# Patient Record
Sex: Male | Born: 1960
Health system: Southern US, Community
[De-identification: ages and names within clinical notes are randomized; demographics above are authoritative.]

## PROBLEM LIST (undated history)

## (undated) DIAGNOSIS — I10 Essential (primary) hypertension: Secondary | ICD-10-CM

## (undated) DIAGNOSIS — I493 Ventricular premature depolarization: Secondary | ICD-10-CM

## (undated) DIAGNOSIS — M1990 Unspecified osteoarthritis, unspecified site: Secondary | ICD-10-CM

## (undated) DIAGNOSIS — H269 Unspecified cataract: Secondary | ICD-10-CM

## (undated) DIAGNOSIS — K219 Gastro-esophageal reflux disease without esophagitis: Secondary | ICD-10-CM

## (undated) DIAGNOSIS — G4730 Sleep apnea, unspecified: Secondary | ICD-10-CM

## (undated) DIAGNOSIS — T7840XA Allergy, unspecified, initial encounter: Secondary | ICD-10-CM

## (undated) DIAGNOSIS — E7439 Other disorders of intestinal carbohydrate absorption: Secondary | ICD-10-CM

## (undated) DIAGNOSIS — R7303 Prediabetes: Secondary | ICD-10-CM

## (undated) DIAGNOSIS — K429 Umbilical hernia without obstruction or gangrene: Secondary | ICD-10-CM

## (undated) DIAGNOSIS — E785 Hyperlipidemia, unspecified: Secondary | ICD-10-CM

## (undated) DIAGNOSIS — E669 Obesity, unspecified: Secondary | ICD-10-CM

## (undated) DIAGNOSIS — N529 Male erectile dysfunction, unspecified: Secondary | ICD-10-CM

## (undated) HISTORY — DX: Prediabetes: R73.03

## (undated) HISTORY — DX: Unspecified osteoarthritis, unspecified site: M19.90

## (undated) HISTORY — DX: Unspecified cataract: H26.9

## (undated) HISTORY — DX: Sleep apnea, unspecified: G47.30

## (undated) HISTORY — DX: Gastro-esophageal reflux disease without esophagitis: K21.9

## (undated) HISTORY — DX: Umbilical hernia without obstruction or gangrene: K42.9

## (undated) HISTORY — PX: HERNIA REPAIR: SHX51

## (undated) HISTORY — PX: POLYPECTOMY: SHX149

## (undated) HISTORY — DX: Male erectile dysfunction, unspecified: N52.9

## (undated) HISTORY — DX: Hyperlipidemia, unspecified: E78.5

## (undated) HISTORY — PX: COLONOSCOPY: SHX174

## (undated) HISTORY — PX: APPENDECTOMY: SHX54

## (undated) HISTORY — DX: Other disorders of intestinal carbohydrate absorption: E74.39

## (undated) HISTORY — DX: Obesity, unspecified: E66.9

## (undated) HISTORY — PX: TONSILLECTOMY: SUR1361

## (undated) HISTORY — PX: CHOLECYSTECTOMY: SHX55

## (undated) HISTORY — DX: Essential (primary) hypertension: I10

## (undated) HISTORY — PX: CATARACT EXTRACTION: SUR2

## (undated) HISTORY — DX: Ventricular premature depolarization: I49.3

## (undated) HISTORY — DX: Allergy, unspecified, initial encounter: T78.40XA

---

## 1998-06-20 ENCOUNTER — Emergency Department (HOSPITAL_COMMUNITY): Admission: EM | Admit: 1998-06-20 | Discharge: 1998-06-20 | Payer: Self-pay | Admitting: Emergency Medicine

## 1998-06-20 ENCOUNTER — Encounter: Payer: Self-pay | Admitting: Emergency Medicine

## 2002-09-13 ENCOUNTER — Encounter (INDEPENDENT_AMBULATORY_CARE_PROVIDER_SITE_OTHER): Payer: Self-pay

## 2002-09-13 ENCOUNTER — Inpatient Hospital Stay (HOSPITAL_COMMUNITY): Admission: EM | Admit: 2002-09-13 | Discharge: 2002-09-14 | Payer: Self-pay | Admitting: Emergency Medicine

## 2005-09-24 ENCOUNTER — Ambulatory Visit: Payer: Self-pay | Admitting: Family Medicine

## 2005-10-01 ENCOUNTER — Encounter: Admission: RE | Admit: 2005-10-01 | Discharge: 2005-12-30 | Payer: Self-pay | Admitting: Family Medicine

## 2005-11-09 ENCOUNTER — Ambulatory Visit: Payer: Self-pay | Admitting: Family Medicine

## 2006-12-07 ENCOUNTER — Ambulatory Visit: Payer: Self-pay | Admitting: Family Medicine

## 2006-12-07 LAB — CONVERTED CEMR LAB
ALT: 28 units/L (ref 0–40)
AST: 22 units/L (ref 0–37)
Albumin: 4.4 g/dL (ref 3.5–5.2)
Alkaline Phosphatase: 53 units/L (ref 39–117)
BUN: 19 mg/dL (ref 6–23)
Basophils Absolute: 0.1 10*3/uL (ref 0.0–0.1)
Basophils Relative: 1 % (ref 0.0–1.0)
Bilirubin, Direct: 0.1 mg/dL (ref 0.0–0.3)
CO2: 32 meq/L (ref 19–32)
Calcium: 9.8 mg/dL (ref 8.4–10.5)
Chloride: 102 meq/L (ref 96–112)
Cholesterol: 228 mg/dL (ref 0–200)
Creatinine, Ser: 1 mg/dL (ref 0.4–1.5)
Direct LDL: 120.6 mg/dL
Eosinophils Absolute: 0.3 10*3/uL (ref 0.0–0.6)
Eosinophils Relative: 5.9 % — ABNORMAL HIGH (ref 0.0–5.0)
GFR calc Af Amer: 104 mL/min
GFR calc non Af Amer: 86 mL/min
Glucose, Bld: 107 mg/dL — ABNORMAL HIGH (ref 70–99)
HCT: 46.4 % (ref 39.0–52.0)
HDL: 29.7 mg/dL — ABNORMAL LOW (ref >39.0)
Hemoglobin: 16 g/dL (ref 13.0–17.0)
Lymphocytes Relative: 27.2 % (ref 12.0–46.0)
MCHC: 34.4 g/dL (ref 30.0–36.0)
MCV: 86.3 fL (ref 78.0–100.0)
Monocytes Absolute: 0.5 10*3/uL (ref 0.2–0.7)
Monocytes Relative: 8.6 % (ref 3.0–11.0)
Neutro Abs: 3 10*3/uL (ref 1.4–7.7)
Neutrophils Relative %: 57.3 % (ref 43.0–77.0)
Platelets: 212 10*3/uL (ref 150–400)
Potassium: 3.9 meq/L (ref 3.5–5.1)
RBC: 5.38 M/uL (ref 4.22–5.81)
RDW: 12.3 % (ref 11.5–14.6)
Sodium: 141 meq/L (ref 135–145)
TSH: 2.33 microintl units/mL (ref 0.35–5.50)
Total Bilirubin: 1 mg/dL (ref 0.3–1.2)
Total CHOL/HDL Ratio: 7.7
Total Protein: 7.3 g/dL (ref 6.0–8.3)
Triglycerides: 266 mg/dL (ref 0–149)
VLDL: 53 mg/dL — ABNORMAL HIGH (ref 0–40)
WBC: 5.3 10*3/uL (ref 4.5–10.5)

## 2006-12-16 ENCOUNTER — Ambulatory Visit: Payer: Self-pay | Admitting: Family Medicine

## 2006-12-16 DIAGNOSIS — I10 Essential (primary) hypertension: Secondary | ICD-10-CM | POA: Insufficient documentation

## 2006-12-29 ENCOUNTER — Ambulatory Visit: Payer: Self-pay | Admitting: Pulmonary Disease

## 2007-02-01 ENCOUNTER — Ambulatory Visit: Payer: Self-pay | Admitting: Pulmonary Disease

## 2007-02-01 ENCOUNTER — Ambulatory Visit (HOSPITAL_BASED_OUTPATIENT_CLINIC_OR_DEPARTMENT_OTHER): Admission: RE | Admit: 2007-02-01 | Discharge: 2007-02-01 | Payer: Self-pay | Admitting: Pulmonary Disease

## 2007-03-30 ENCOUNTER — Ambulatory Visit: Payer: Self-pay | Admitting: Pulmonary Disease

## 2007-05-17 ENCOUNTER — Ambulatory Visit: Payer: Self-pay | Admitting: Pulmonary Disease

## 2007-05-23 ENCOUNTER — Ambulatory Visit: Payer: Self-pay | Admitting: Family Medicine

## 2007-05-23 ENCOUNTER — Telehealth: Payer: Self-pay | Admitting: Family Medicine

## 2007-05-23 DIAGNOSIS — N529 Male erectile dysfunction, unspecified: Secondary | ICD-10-CM | POA: Insufficient documentation

## 2007-05-23 LAB — CONVERTED CEMR LAB
BUN: 16 mg/dL (ref 6–23)
CO2: 30 meq/L (ref 19–32)
Calcium: 10.3 mg/dL (ref 8.4–10.5)
Chloride: 97 meq/L (ref 96–112)
Creatinine, Ser: 1.1 mg/dL (ref 0.4–1.5)
GFR calc Af Amer: 93 mL/min
GFR calc non Af Amer: 77 mL/min
Glucose, Bld: 95 mg/dL (ref 70–99)
Hgb A1c MFr Bld: 5.8 % (ref 4.6–6.0)
Potassium: 3.7 meq/L (ref 3.5–5.1)
Sodium: 137 meq/L (ref 135–145)

## 2008-06-25 ENCOUNTER — Ambulatory Visit: Payer: Self-pay | Admitting: Family Medicine

## 2008-06-25 DIAGNOSIS — K429 Umbilical hernia without obstruction or gangrene: Secondary | ICD-10-CM | POA: Insufficient documentation

## 2008-09-14 ENCOUNTER — Ambulatory Visit: Payer: Self-pay | Admitting: Family Medicine

## 2008-09-14 LAB — CONVERTED CEMR LAB
ALT: 50 units/L (ref 0–53)
AST: 36 units/L (ref 0–37)
Albumin: 4.3 g/dL (ref 3.5–5.2)
Alkaline Phosphatase: 60 units/L (ref 39–117)
BUN: 18 mg/dL (ref 6–23)
Basophils Absolute: 0 10*3/uL (ref 0.0–0.1)
Basophils Relative: 0.1 % (ref 0.0–3.0)
Bilirubin, Direct: 0.1 mg/dL (ref 0.0–0.3)
CO2: 29 meq/L (ref 19–32)
Calcium: 9.5 mg/dL (ref 8.4–10.5)
Chloride: 105 meq/L (ref 96–112)
Cholesterol: 243 mg/dL (ref 0–200)
Creatinine, Ser: 1.1 mg/dL (ref 0.4–1.5)
Direct LDL: 149.9 mg/dL
Eosinophils Absolute: 0.4 10*3/uL (ref 0.0–0.7)
Eosinophils Relative: 6.6 % — ABNORMAL HIGH (ref 0.0–5.0)
GFR calc Af Amer: 92 mL/min
GFR calc non Af Amer: 76 mL/min
Glucose, Bld: 123 mg/dL — ABNORMAL HIGH (ref 70–99)
Glucose, Urine, Semiquant: NEGATIVE
HCT: 47.3 % (ref 39.0–52.0)
HDL: 36.8 mg/dL — ABNORMAL LOW (ref >39.0)
Hemoglobin: 16.4 g/dL (ref 13.0–17.0)
Hgb A1c MFr Bld: 5.6 % (ref 4.6–6.0)
Lymphocytes Relative: 29.6 % (ref 12.0–46.0)
MCHC: 34.7 g/dL (ref 30.0–36.0)
MCV: 88.2 fL (ref 78.0–100.0)
Monocytes Absolute: 0.5 10*3/uL (ref 0.1–1.0)
Monocytes Relative: 9.1 % (ref 3.0–12.0)
Neutro Abs: 2.9 10*3/uL (ref 1.4–7.7)
Neutrophils Relative %: 54.6 % (ref 43.0–77.0)
Nitrite: NEGATIVE
PSA: 1.42 ng/mL (ref 0.10–4.00)
Platelets: 182 10*3/uL (ref 150–400)
Potassium: 4 meq/L (ref 3.5–5.1)
RBC: 5.36 M/uL (ref 4.22–5.81)
RDW: 12.5 % (ref 11.5–14.6)
Sodium: 144 meq/L (ref 135–145)
Specific Gravity, Urine: 1.025
TSH: 2.22 microintl units/mL (ref 0.35–5.50)
Total Bilirubin: 0.9 mg/dL (ref 0.3–1.2)
Total CHOL/HDL Ratio: 6.6
Total Protein: 7.2 g/dL (ref 6.0–8.3)
Triglycerides: 164 mg/dL — ABNORMAL HIGH (ref 0–149)
Urobilinogen, UA: 0.2
VLDL: 33 mg/dL (ref 0–40)
WBC Urine, dipstick: NEGATIVE
WBC: 5.4 10*3/uL (ref 4.5–10.5)
pH: 5.5

## 2008-09-21 ENCOUNTER — Ambulatory Visit: Payer: Self-pay | Admitting: Family Medicine

## 2008-09-21 DIAGNOSIS — E739 Lactose intolerance, unspecified: Secondary | ICD-10-CM | POA: Insufficient documentation

## 2008-11-14 ENCOUNTER — Ambulatory Visit: Payer: Self-pay | Admitting: Family Medicine

## 2008-11-30 DIAGNOSIS — G4733 Obstructive sleep apnea (adult) (pediatric): Secondary | ICD-10-CM | POA: Insufficient documentation

## 2008-12-03 ENCOUNTER — Ambulatory Visit: Payer: Self-pay | Admitting: Cardiovascular Disease

## 2008-12-03 DIAGNOSIS — R079 Chest pain, unspecified: Secondary | ICD-10-CM | POA: Insufficient documentation

## 2008-12-13 ENCOUNTER — Telehealth (INDEPENDENT_AMBULATORY_CARE_PROVIDER_SITE_OTHER): Payer: Self-pay | Admitting: *Deleted

## 2008-12-17 ENCOUNTER — Encounter: Payer: Self-pay | Admitting: Cardiovascular Disease

## 2008-12-17 ENCOUNTER — Ambulatory Visit: Payer: Self-pay

## 2008-12-20 ENCOUNTER — Ambulatory Visit: Payer: Self-pay | Admitting: Cardiovascular Disease

## 2010-05-26 ENCOUNTER — Telehealth: Payer: Self-pay | Admitting: Family Medicine

## 2010-07-23 ENCOUNTER — Telehealth: Payer: Self-pay | Admitting: Family Medicine

## 2010-07-31 ENCOUNTER — Other Ambulatory Visit: Payer: Self-pay | Admitting: Family Medicine

## 2010-07-31 ENCOUNTER — Ambulatory Visit
Admission: RE | Admit: 2010-07-31 | Discharge: 2010-07-31 | Payer: Self-pay | Source: Home / Self Care | Attending: Family Medicine | Admitting: Family Medicine

## 2010-07-31 LAB — BASIC METABOLIC PANEL
BUN: 27 mg/dL — ABNORMAL HIGH (ref 6–23)
CO2: 28 mEq/L (ref 19–32)
Calcium: 10 mg/dL (ref 8.4–10.5)
Chloride: 97 mEq/L (ref 96–112)
Creatinine, Ser: 1 mg/dL (ref 0.4–1.5)
GFR: 85.43 mL/min (ref >60.00)
Glucose, Bld: 122 mg/dL — ABNORMAL HIGH (ref 70–99)
Potassium: 3.7 mEq/L (ref 3.5–5.1)
Sodium: 138 mEq/L (ref 135–145)

## 2010-07-31 LAB — CBC WITH DIFFERENTIAL/PLATELET
Basophils Absolute: 0 10*3/uL (ref 0.0–0.1)
Basophils Relative: 0.5 % (ref 0.0–3.0)
Eosinophils Absolute: 0.2 10*3/uL (ref 0.0–0.7)
Eosinophils Relative: 3.2 % (ref 0.0–5.0)
HCT: 49.8 % (ref 39.0–52.0)
Hemoglobin: 17.3 g/dL — ABNORMAL HIGH (ref 13.0–17.0)
Lymphocytes Relative: 15.8 % (ref 12.0–46.0)
Lymphs Abs: 1 10*3/uL (ref 0.7–4.0)
MCHC: 34.6 g/dL (ref 30.0–36.0)
MCV: 89.6 fl (ref 78.0–100.0)
Monocytes Absolute: 0.5 10*3/uL (ref 0.1–1.0)
Monocytes Relative: 7.4 % (ref 3.0–12.0)
Neutro Abs: 4.7 10*3/uL (ref 1.4–7.7)
Neutrophils Relative %: 73.1 % (ref 43.0–77.0)
Platelets: 175 10*3/uL (ref 150.0–400.0)
RBC: 5.56 Mil/uL (ref 4.22–5.81)
RDW: 13.1 % (ref 11.5–14.6)
WBC: 6.5 10*3/uL (ref 4.5–10.5)

## 2010-07-31 LAB — CONVERTED CEMR LAB
Blood in Urine, dipstick: NEGATIVE
Glucose, Urine, Semiquant: NEGATIVE
Nitrite: NEGATIVE
Specific Gravity, Urine: >=1.03
Urobilinogen, UA: 1
WBC Urine, dipstick: NEGATIVE
pH: 5.5

## 2010-07-31 LAB — HEPATIC FUNCTION PANEL
ALT: 29 U/L (ref 0–53)
AST: 22 U/L (ref 0–37)
Albumin: 4.8 g/dL (ref 3.5–5.2)
Alkaline Phosphatase: 54 U/L (ref 39–117)
Bilirubin, Direct: 0.1 mg/dL (ref 0.0–0.3)
Total Bilirubin: 1.1 mg/dL (ref 0.3–1.2)
Total Protein: 7.6 g/dL (ref 6.0–8.3)

## 2010-07-31 LAB — TSH: TSH: 1.76 u[IU]/mL (ref 0.35–5.50)

## 2010-07-31 LAB — HEMOGLOBIN A1C: Hgb A1c MFr Bld: 5.4 % (ref 4.6–6.5)

## 2010-07-31 LAB — LIPID PANEL
Cholesterol: 265 mg/dL — ABNORMAL HIGH (ref 0–200)
HDL: 42.3 mg/dL (ref >39.00)
Total CHOL/HDL Ratio: 6
Triglycerides: 149 mg/dL (ref 0.0–149.0)
VLDL: 29.8 mg/dL (ref 0.0–40.0)

## 2010-07-31 LAB — LDL CHOLESTEROL, DIRECT: Direct LDL: 197 mg/dL

## 2010-08-07 ENCOUNTER — Encounter: Payer: Self-pay | Admitting: Family Medicine

## 2010-08-07 ENCOUNTER — Ambulatory Visit
Admission: RE | Admit: 2010-08-07 | Discharge: 2010-08-07 | Payer: Self-pay | Source: Home / Self Care | Attending: Family Medicine | Admitting: Family Medicine

## 2010-08-07 DIAGNOSIS — F438 Other reactions to severe stress: Secondary | ICD-10-CM | POA: Insufficient documentation

## 2010-08-07 DIAGNOSIS — E785 Hyperlipidemia, unspecified: Secondary | ICD-10-CM | POA: Insufficient documentation

## 2010-08-07 DIAGNOSIS — F4389 Other reactions to severe stress: Secondary | ICD-10-CM | POA: Insufficient documentation

## 2010-08-12 ENCOUNTER — Observation Stay (HOSPITAL_COMMUNITY)
Admission: EM | Admit: 2010-08-12 | Discharge: 2010-08-13 | Payer: Self-pay | Source: Home / Self Care | Admitting: Emergency Medicine

## 2010-08-12 ENCOUNTER — Emergency Department (HOSPITAL_COMMUNITY)
Admission: EM | Admit: 2010-08-12 | Discharge: 2010-08-12 | Disposition: A | Discharge status: Other Acute Inpatient Hospital | Payer: Self-pay | Source: Home / Self Care | Admitting: Family Medicine

## 2010-08-13 ENCOUNTER — Encounter (INDEPENDENT_AMBULATORY_CARE_PROVIDER_SITE_OTHER): Payer: Self-pay | Admitting: Emergency Medicine

## 2010-08-13 LAB — BASIC METABOLIC PANEL
BUN: 13 mg/dL (ref 6–23)
CO2: 29 mEq/L (ref 19–32)
Calcium: 10.9 mg/dL — ABNORMAL HIGH (ref 8.4–10.5)
Chloride: 101 mEq/L (ref 96–112)
Creatinine, Ser: 0.98 mg/dL (ref 0.4–1.5)
GFR calc Af Amer: >60 mL/min (ref >60)
GFR calc non Af Amer: >60 mL/min (ref >60)
Glucose, Bld: 102 mg/dL — ABNORMAL HIGH (ref 70–99)
Potassium: 3.6 mEq/L (ref 3.5–5.1)
Sodium: 140 mEq/L (ref 135–145)

## 2010-08-13 LAB — POCT CARDIAC MARKERS
CKMB, poc: <1 ng/mL — ABNORMAL LOW (ref 1.0–8.0)
CKMB, poc: <1 ng/mL — ABNORMAL LOW (ref 1.0–8.0)
CKMB, poc: <1 ng/mL — ABNORMAL LOW (ref 1.0–8.0)
CKMB, poc: <1 ng/mL — ABNORMAL LOW (ref 1.0–8.0)
Myoglobin, poc: 41.4 ng/mL (ref 12–200)
Myoglobin, poc: 40.7 ng/mL (ref 12–200)
Myoglobin, poc: 41.2 ng/mL (ref 12–200)
Myoglobin, poc: 53.4 ng/mL (ref 12–200)
Troponin i, poc: <0.05 ng/mL (ref 0.00–0.09)
Troponin i, poc: <0.05 ng/mL (ref 0.00–0.09)
Troponin i, poc: <0.05 ng/mL (ref 0.00–0.09)
Troponin i, poc: <0.05 ng/mL (ref 0.00–0.09)

## 2010-08-13 LAB — CBC
HCT: 45 % (ref 39.0–52.0)
Hemoglobin: 16.3 g/dL (ref 13.0–17.0)
MCH: 30.2 pg (ref 26.0–34.0)
MCHC: 36.2 g/dL — ABNORMAL HIGH (ref 30.0–36.0)
MCV: 83.3 fL (ref 78.0–100.0)
Platelets: 202 10*3/uL (ref 150–400)
RBC: 5.4 MIL/uL (ref 4.22–5.81)
RDW: 12.2 % (ref 11.5–15.5)
WBC: 8.9 10*3/uL (ref 4.0–10.5)

## 2010-08-18 ENCOUNTER — Ambulatory Visit
Admission: RE | Admit: 2010-08-18 | Discharge: 2010-08-18 | Payer: Self-pay | Source: Home / Self Care | Attending: Cardiovascular Disease | Admitting: Cardiovascular Disease

## 2010-08-22 ENCOUNTER — Telehealth: Payer: Self-pay | Admitting: Cardiovascular Disease

## 2010-08-25 ENCOUNTER — Telehealth: Payer: Self-pay | Admitting: Family Medicine

## 2010-08-26 NOTE — Progress Notes (Signed)
Summary: hctz refill  Phone Note Refill Request Message from:  Fax from Pharmacy on May 26, 2010 12:21 PM  Refills Requested: Medication #1:  HYDROCHLOROTHIAZIDE 25 MG TABS 1 tablet by mouth every morning Initial call taken by: Kern Reap CMA Duncan Dull),  May 26, 2010 12:21 PM    Prescriptions: HYDROCHLOROTHIAZIDE 25 MG TABS (HYDROCHLOROTHIAZIDE) 1 tablet by mouth every morning  #30 x 0   Entered by:   Kern Reap CMA (AAMA)   Authorized by:   Roderick Pee MD   Signed by:   Kern Reap CMA (AAMA) on 05/26/2010   Method used:   Electronically to        [facility]* (retail)       [address]       [address] Shipping/mailing       [place], [place]  [address]       Ph: [phone]       Fax: [phone]   RxID:   [id number]

## 2010-08-28 NOTE — Progress Notes (Signed)
Summary: rx sent to [facility] today, pt aware   Phone Note Call from Patient Call back at [phone]   Caller: Mom Summary of Call: Pt needs more samples of Nexium Initial call taken by: Judie Grieve,  August 22, 2010 4:47 PM    Prescriptions: NEXIUM 40 MG PACK (ESOMEPRAZOLE MAGNESIUM) take one tablet by mouth daily.  #90 x 0   Entered by:   Danielle Rankin, CMA   Authorized by:   Verne Carrow, MD   Signed by:   Danielle Rankin, CMA on 08/22/2010   Method used:   Electronically to        [facility]* (retail)       [address]       [address] Shipping/mailing       [place], [place]  [address]       Ph: [phone]       Fax: [phone]   RxID:   [id number]

## 2010-08-28 NOTE — Assessment & Plan Note (Signed)
Summary: cpx//ccm   Vital Signs:  Patient profile:   50 year old male Height:      68.25 inches Weight:      201 pounds BMI:     30.45 Temp:     98.3 degrees F oral BP sitting:   100 / 70  (left arm) Cuff size:   regular  Vitals Entered By: Romualdo Bolk, CMA Duncan Dull) (August 07, 2010 3:57 PM) CC: CPX   Primary Care Provider:  Roderick Pee MD  CC:  CPX.  History of Present Illness: Nadine Counts  is a 50 year old woman, who comes in today for general physical examination  He only takes a baby aspirin daily.  He takes a hydrochlorothiazide 25 mg nightly p.r.n. for fluid retention.  His screening labs show a slightly elevated blood sugar 122 however, his hemoglobin A1c is normal.  Over the past year and a, half he's lost about 50 pounds via diet and exercise.  Lipids panel abnormal with a total cholesterol 265 and a LDL of 197.  Both his mother and his father are on lipid-lowering drugs.  He recently got a promotion in the [employer], and this is causing tremendous stress and anxiety, especially with the new [employer] computer system.  He seems to be functioning well.  No depression, but wants to know if there is something he takes on a p.r.n. for anxiety.  If he needs it.  Tetanus 2008.  Preventive Screening-Counseling & Management  Alcohol-Tobacco     Smoking Status: never  Caffeine-Diet-Exercise     Does Patient Exercise: no     Exercise (avg: min/session): 12:00  Current Medications (verified): 1)  Hydrochlorothiazide 25 Mg Tabs (Hydrochlorothiazide) .Marland Kitchen.. 1 Tablet By Mouth Every Morning 2)  Aspirin 81 Mg Tbec (Aspirin) .... Take One Tablet By Mouth Daily  Allergies (verified): No Known Drug Allergies  Past History:  Past medical, surgical, family and social histories (including risk factors) reviewed, and no changes noted (except as noted below).  Past Medical History: Reviewed history from 12/03/2008 and no changes required. PREMATURE VENTRICULAR CONTRACTIONS  (ICD-427.69) HYPERTENSION (ICD-401.9) OBESITY (ICD-278.00) GLUCOSE INTOLERANCE (ICD-271.3) SLEEP APNEA (ICD-780.57) UMBILICAL HERNIA (ICD-553.1) ABNORMAL GLUCOSE NEC (ICD-790.29) ERECTILE DYSFUNCTION, ORGANIC (ICD-607.84) Hyperlipidemia    Past Surgical History: Reviewed history from 12/03/2008 and no changes required. Appendectomy Tonsillectomy  Family History: Reviewed history from 12/03/2008 and no changes required. Mother: Bipolar disorder, arthritis Father:  Alive, intracerebral hemorrhage Pt denies family history of major GI disease, cancer, or diabetes. No CAD Grandmother with CHF in early 42s  Social History: Reviewed history from 12/03/2008 and no changes required. 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 No apparent alopecia or balding. Eyes:  No corneal or conjunctival inflammation noted. EOMI. Perrla. Funduscopic exam benign, without hemorrhages, exudates or papilledema. Vision grossly normal. Ears:  External ear exam shows no significant lesions or deformities.  Otoscopic examination reveals clear canals, tympanic membranes are intact bilaterally without bulging, retraction, inflammation or discharge. Hearing is grossly normal bilaterally. Nose:  External nasal examination shows no deformity or inflammation. Nasal mucosa are pink and moist without lesions or exudates. Mouth:  Oral mucosa and oropharynx without lesions or exudates.  Teeth in good repair. Neck:  No deformities, masses, or tenderness noted. Chest Wall:  No deformities, masses, tenderness or gynecomastia noted. Breasts:  No masses or gynecomastia  noted Lungs:  Normal respiratory effort, chest expands symmetrically. Lungs are clear to auscultation, no crackles or wheezes. Heart:  Normal rate and regular rhythm. S1 and S2 normal without gallop, murmur, click, rub or other extra sounds. Abdomen:  Bowel sounds positive,abdomen soft and non-tender without masses, organomegaly or hernias noted. Rectal:  No external abnormalities noted. Normal sphincter tone. No rectal masses or tenderness. Genitalia:  Testes bilaterally descended without nodularity, tenderness or masses. No scrotal masses or lesions. No penis lesions or urethral discharge. Prostate:  Prostate gland firm and smooth, no enlargement, nodularity, tenderness, mass, asymmetry or induration. Msk:  No deformity or scoliosis noted of thoracic or lumbar spine.   Pulses:  R and L carotid,radial,femoral,dorsalis pedis and posterior tibial pulses are full and equal bilaterally Extremities:  No clubbing, cyanosis, edema, or deformity noted with normal full range of motion of all joints.   Neurologic:  No cranial nerve deficits noted. Station and gait are normal. Plantar reflexes are down-going bilaterally. DTRs are symmetrical throughout. Sensory, motor and coordinative functions appear intact. Skin:  Intact without suspicious lesions or rashes Cervical Nodes:  No lymphadenopathy noted Axillary Nodes:  No palpable lymphadenopathy Inguinal Nodes:  No significant adenopathy Psych:  Cognition and judgment appear intact. Alert and cooperative with normal attention span and concentration. No apparent delusions, illusions, hallucinations   Problems:  Medical Problems Added: 1)  Dx of Routine General Medical Exam@health  Care Facl  (ICD-V70.0) 2)  Dx of Routine General Medical Exam@health  Care Facl  (ICD-V70.0) 3)  Dx of Anxiety, Situational, Acute  (ICD-308.3) 4)  Dx of Hyperlipidemia  (ICD-272.4)  Impression & Recommendations:  Problem # 1:  ANXIETY, SITUATIONAL, ACUTE  (ICD-308.3) Assessment New  Orders: Prescription Created Electronically [phone])  Problem # 2:  HYPERLIPIDEMIA (ICD-272.4) Assessment: New  His updated medication list for this problem includes:    Simvastatin 20 Mg Tabs (Simvastatin) .Marland Kitchen... 1 tab @ bedtime  Orders: Prescription Created Electronically [phone])  Problem # 3:  HYPERTENSION (ICD-401.9) Assessment: Improved  His updated medication list for this problem includes:    Hydrochlorothiazide 25 Mg Tabs (Hydrochlorothiazide) .Marland Kitchen... 1 tablet by mouth every morning  Orders: Prescription Created Electronically [phone])  Problem # 4:  OBESITY (ICD-278.00) Assessment: Improved  Problem # 5:  GLUCOSE INTOLERANCE (ICD-271.3) Assessment: Improved  Orders: Prescription Created Electronically [phone])  Complete Medication List: 1)  Hydrochlorothiazide 25 Mg Tabs (Hydrochlorothiazide) .Marland Kitchen.. 1 tablet by mouth every morning 2)  Aspirin 81 Mg Tbec (Aspirin) .... Take one tablet by mouth daily 3)  Simvastatin 20 Mg Tabs (Simvastatin) .Marland Kitchen.. 1 tab @ bedtime 4)  Ativan 0.5 Mg Tabs (Lorazepam) .Marland Kitchen.. 1 by mouth two times a day as needed  Patient Instructions: 1)  begin Zocor 20 mg nightly 2)  Hepatic Panel prior to visit, ICD-9:....250.00 3)  Lipid Panel prior to visit, ICD-9: 4)  Please schedule a follow-up appointment in 2 months. 5)  Check a fasting blood sugar once weekly. 6)  Continue your exercise program. 7)  Ativan .5 p.r.n. Prescriptions: SIMVASTATIN 20 MG TABS (SIMVASTATIN) 1 tab @ bedtime  #100 x 3   Entered and Authorized by:   Roderick Pee MD   Signed by:   Roderick Pee MD on 08/07/2010   Method used:   Electronically to        [facility]* (retail)  [address]       [address] Shipping/mailing       [place], [place]  [address]       Ph: [phone]       Fax: [phone]   RxID:   [id number] ATIVAN 0.5 MG TABS (LORAZEPAM) 1 by mouth two times a day as needed  #60 x 3   Entered  and Authorized by:   Roderick Pee MD   Signed by:   Roderick Pee MD on 08/07/2010   Method used:   Print then Give to Patient   RxID:   [id number] SIMVASTATIN 20 MG TABS (SIMVASTATIN) 1 tab @ bedtime  #100 x 3   Entered and Authorized by:   Roderick Pee MD   Signed by:   Roderick Pee MD on 08/07/2010   Method used:   Electronically to        [facility]* (retail)       [address]       [address] Shipping/mailing       [place], [place]  [address]       Ph: [phone]       Fax: [phone]   RxID:   [id number]    Orders Added: 1)  Est. Patient 40-64 years [[address]] 2)  Prescription Created Electronically [phone]   Immunization History:  Tetanus/Td Immunization History:    Tetanus/Td:  historical (07/27/2006)   Immunization History:  Tetanus/Td Immunization History:    Tetanus/Td:  Historical (07/27/2006)

## 2010-08-28 NOTE — Assessment & Plan Note (Signed)
Summary: ROV/PT HAD CP X 2 WKS/ED 2 DAYS AGO WITH TESTING/MYOVIEW/GLC  Medications Added NEXIUM 40 MG PACK (ESOMEPRAZOLE MAGNESIUM) take one tablet by mouth daily.      Allergies Added: NKDA  Visit Type:  Follow-up Primary Provider:  Roderick Pee MD  CC:  chest pain.  History of Present Illness: 50 yo WM with history of hyperlipidemia, HTN, borderline DM, obesity and sleep apnea who was referred for last year for further evaluation of PVCs and chest pain with exercise. 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 This showed PVCs. He was sent to see Dr. Tawanna Cooler for further evaluation and had a 12 lead EKG that showed PVCs with normal sinus rhythm.  He was referred for a nuclear stress test to rule out ischemia and a surface echo to look for structural heart problems. He exercised for 12 minutes and had no ischemic EKG changes. His perfusion images were normal. ECHO showed normal LV function with mild diastolic dysfunction. No significant valvular issues.   He is here today for follow up. He tells me that he had an episode of chest pain last week. He was at work when it started. He was admitted to [facility]. He ruled out for an MI with enzymes. Stress echo on 08/13/10 was normal. No ischemic EKG changes with exercise. He describes a weight on his chest. This lasts for hours. This is substernal with no radiation and no associated diaphoresis or SOB but there was some nausea. No exertional pain. He has not been aware of any irregularity of his heart rhythm.    Current Medications (verified): 1)  Hydrochlorothiazide 25 Mg Tabs (Hydrochlorothiazide) .Marland Kitchen.. 1 Tablet By Mouth Every Morning 2)  Aspirin 81 Mg Tbec (Aspirin) .... Take One Tablet By Mouth Daily 3)  Simvastatin 20 Mg Tabs (Simvastatin) .Marland Kitchen.. 1 Tab @ Bedtime 4)  Ativan 0.5 Mg Tabs (Lorazepam) .Marland Kitchen.. 1 By Mouth Two Times A Day As  Needed  Allergies (verified): No Known Drug Allergies  Past History:  Past Medical History: Reviewed history from 12/03/2008 and no changes required. PREMATURE VENTRICULAR CONTRACTIONS (ICD-427.69) HYPERTENSION (ICD-401.9) OBESITY (ICD-278.00) GLUCOSE INTOLERANCE (ICD-271.3) SLEEP APNEA (ICD-780.57) UMBILICAL HERNIA (ICD-553.1) ABNORMAL GLUCOSE NEC (ICD-790.29) ERECTILE DYSFUNCTION, ORGANIC (ICD-607.84) Hyperlipidemia    Past Surgical History: Reviewed history from 12/03/2008 and no changes required. Appendectomy Tonsillectomy  Family History: Reviewed history from 12/03/2008 and no changes required. Mother: Bipolar disorder, arthritis Father:  Alive, intracerebral hemorrhage Pt denies family history of major GI disease, cancer, or diabetes. No CAD Grandmother with CHF in early 76s  Social History: Reviewed history from 12/03/2008 and no changes required. 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  The patient denies fatigue, malaise, fever, weight gain/loss, vision loss, decreased hearing, hoarseness, palpitations, shortness of breath, prolonged cough, wheezing, sleep apnea, coughing up blood, abdominal pain, blood in stool, nausea, vomiting, diarrhea, heartburn, incontinence, blood in urine, muscle weakness, joint pain, leg swelling, rash, skin lesions, headache, fainting, dizziness, depression, anxiety, enlarged lymph nodes, easy bruising or bleeding, and environmental allergies.    Vital Signs:  Patient profile:   50 year old male Height:      68.25 inches Weight:  215 pounds BMI:     32.57 Pulse rate:   71 / minute Resp:     16 per minute BP sitting:   132 / 85  (left arm)  Vitals Entered By: Marrion Coy, CNA (August 18, 2010 9:36 AM)  Physical Exam  General:  General: Well  developed, well nourished, NAD HEENT: OP clear, mucus membranes moist SKIN: warm, dry Neuro: No focal deficits Musculoskeletal: Muscle strength 5/5 all ext Psychiatric: Mood and affect normal Neck: No JVD, no carotid bruits, no thyromegaly, no lymphadenopathy. Lungs:Clear bilaterally, no wheezes, rhonci, crackles CV: RRR no murmurs, gallops rubs Abdomen: soft, NT, ND, BS present Extremities: No edema, pulses 2+.    EKG  Procedure date:  08/12/2010  Findings:      NSR, rate 89 bpm. LAFB.  Stress Echocardiogram  Procedure date:  08/13/2010  Findings:      - Stress ECG conclusions: The stress ECG was normal.   - Staged echo: Normal echo stress   - Impressions: Good exercise tolerance.   Impressions:    - Normal study after maximal exercise. Good exercise tolerance.  Impression & Recommendations:  Problem # 1:  CHEST PAIN (ICD-786.50) Atypical.  Most likely non-cardiac. Stress echo normal last week. Possible GERD. Will start Nexium 40 mg by mouth Qdaily and see back in 2 weeks. If still having chest pain, will consider cardiac cath to exclude CAD.   His updated medication list for this problem includes:    Aspirin 81 Mg Tbec (Aspirin) .Marland Kitchen... Take one tablet by mouth daily  Patient Instructions: 1)  Your physician recommends that you schedule a follow-up appointment in: 2 weeks 2)  Your physician recommends that you continue on your current medications as directed. Please refer to the Current Medication list given to you today. Start Nexium  40mg  by mouth daily.

## 2010-08-28 NOTE — Progress Notes (Signed)
Summary: med refill  Phone Note Refill Request Message from:  Patient  Refills Requested: Medication #1:  HYDROCHLOROTHIAZIDE 25 MG TABS 1 tablet by mouth every morning pt has sch ov in 08-11-2010 needs refill please. [facility] [id number]  Initial call taken by: Heron Sabins,  July 23, 2010 3:56 PM  Follow-up for Phone Call        Rx called to pharmacy Follow-up by: Alfred Levins, CMA,  July 23, 2010 4:33 PM    Prescriptions: HYDROCHLOROTHIAZIDE 25 MG TABS (HYDROCHLOROTHIAZIDE) 1 tablet by mouth every morning  #30 x 0   Entered by:   Alfred Levins, CMA   Authorized by:   Roderick Pee MD   Signed by:   Alfred Levins, CMA on 07/23/2010   Method used:   Electronically to        [facility]* (retail)       [address]       [address] Shipping/mailing       [place], [place]  [address]       Ph: [phone]       Fax: [phone]   RxID:   [id number]

## 2010-09-03 NOTE — Progress Notes (Signed)
Summary: hctz refill  Phone Note Refill Request Message from:  Fax from Pharmacy on August 25, 2010 4:46 PM  Refills Requested: Medication #1:  HYDROCHLOROTHIAZIDE 25 MG TABS 1 tablet by mouth every morning Initial call taken by: Kern Reap CMA Duncan Dull),  August 25, 2010 4:46 PM    Prescriptions: HYDROCHLOROTHIAZIDE 25 MG TABS (HYDROCHLOROTHIAZIDE) 1 tablet by mouth every morning  #90 x 3   Entered by:   Kern Reap CMA (AAMA)   Authorized by:   Roderick Pee MD   Signed by:   Kern Reap CMA (AAMA) on 08/25/2010   Method used:   Electronically to        [facility]* (retail)       [address]       [address] Shipping/mailing       [place], [place]  [address]       Ph: [phone]       Fax: [phone]   RxID:   [id number]

## 2010-09-05 ENCOUNTER — Encounter: Payer: Self-pay | Admitting: Cardiovascular Disease

## 2010-09-05 ENCOUNTER — Ambulatory Visit (INDEPENDENT_AMBULATORY_CARE_PROVIDER_SITE_OTHER): Payer: Commercial Managed Care - PPO | Admitting: Cardiovascular Disease

## 2010-09-05 DIAGNOSIS — R079 Chest pain, unspecified: Secondary | ICD-10-CM

## 2010-09-11 NOTE — Assessment & Plan Note (Addendum)
Summary: f/u 2 weeks/wpa      Allergies Added: NKDA  Visit Type:  2 -week follow up  Primary Provider:  Roderick Pee MD  CC:  still having chest pain.  History of Present Illness: 50 yo WM with history of hyperlipidemia, HTN, borderline DM, obesity and sleep apnea who was referred last year for further evaluation of PVCs and chest pain with exercise. 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 This showed PVCs. He was sent to see Dr. Tawanna Cooler for further evaluation and had a 12 lead EKG that showed PVCs with normal sinus rhythm.  He was referred for a nuclear stress test to rule out ischemia and a surface echo to look for structural heart problems. He exercised for 12 minutes and had no ischemic EKG changes. His perfusion images were normal. ECHO showed normal LV function with mild diastolic dysfunction. No significant valvular issues.   I saw him two weeks ago for follow up. He told me that he had an episode of severe chest pain and was admitted to [facility] where he ruled out for an MI with serial enzymes. Stress echo on 08/13/10 was normal. No ischemic EKG changes with exercise. He described  a weight on his chest. This lasts for hours. This is substernal with no radiation and no associated diaphoresis or SOB but there was some nausea. No exertional pain. He has not been aware of any irregularity of his heart rhythm. I started him on Nexium at the last visit. His chest pain resolved for 10 days. He had recurrence at work three days ago while stressed.  This is described as a heaviness in the center of his chest. No associated SOB, dizziness, near syncope, syncope, diaphoresis.   Current Medications (verified): 1)  Hydrochlorothiazide 25 Mg Tabs (Hydrochlorothiazide) .Marland Kitchen.. 1 Tablet By Mouth Every Morning 2)  Aspirin 81 Mg Tbec (Aspirin) .... Take One Tablet By Mouth Daily 3)   Simvastatin 20 Mg Tabs (Simvastatin) .Marland Kitchen.. 1 Tab @ Bedtime 4)  Ativan 0.5 Mg Tabs (Lorazepam) .Marland Kitchen.. 1 By Mouth Two Times A Day As Needed 5)  Nexium 40 Mg Pack (Esomeprazole Magnesium) .... Take One Tablet By Mouth Daily.  Allergies (verified): No Known Drug Allergies  Past History:  Past Medical History: Reviewed history from 12/03/2008 and no changes required. PREMATURE VENTRICULAR CONTRACTIONS (ICD-427.69) HYPERTENSION (ICD-401.9) OBESITY (ICD-278.00) GLUCOSE INTOLERANCE (ICD-271.3) SLEEP APNEA (ICD-780.57) UMBILICAL HERNIA (ICD-553.1) ABNORMAL GLUCOSE NEC (ICD-790.29) ERECTILE DYSFUNCTION, ORGANIC (ICD-607.84) Hyperlipidemia    Social History: Reviewed history from 12/03/2008 and no changes required. 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  The patient denies fatigue, malaise, fever, weight gain/loss, vision loss, decreased hearing, hoarseness, palpitations, shortness of breath, prolonged cough, wheezing, sleep apnea, coughing up blood, abdominal pain, blood in stool, nausea, vomiting, diarrhea, heartburn, incontinence, blood in urine, muscle weakness, joint pain, leg swelling, rash, skin lesions, headache, fainting, dizziness, depression, anxiety, enlarged lymph nodes, easy bruising or bleeding, and environmental allergies.    Vital Signs:  Patient profile:   50 year old male Height:      68.25 inches Weight:      210.50 pounds BMI:     31.89 Pulse  rate:   72 / minute BP sitting:   108 / 78  (right arm) Cuff size:   regular  Vitals Entered By: Caralee Ates CMA (September 05, 2010 9:27 AM)  Physical Exam  General:  General: Well developed, well nourished, NAD Musculoskeletal: Muscle strength 5/5 all ext Psychiatric: Mood and affect normal Neck: No JVD, no carotid bruits, no thyromegaly, no  lymphadenopathy. Lungs:Clear bilaterally, no wheezes, rhonci, crackles CV: RRR no murmurs, gallops rubs Abdomen: soft, NT, ND, BS present Extremities: No edema, pulses 2+.    Impression & Recommendations:  Problem # 1:  CHEST PAIN (ICD-786.50) Likely non-cardiac pain. Continue PPI. If his pain does not resolve completely after treatment for GERD, would favor GI workup. If this is negative, would likely pursue cardiac cath to exclude CAD.   His updated medication list for this problem includes:    Aspirin 81 Mg Tbec (Aspirin) .Marland Kitchen... Take one tablet by mouth daily  Patient Instructions: 1)  Your physician recommends that you schedule a follow-up appointment in: one year.

## 2010-10-16 ENCOUNTER — Ambulatory Visit: Payer: Commercial Managed Care - PPO

## 2010-10-16 ENCOUNTER — Other Ambulatory Visit: Payer: Self-pay

## 2010-10-16 DIAGNOSIS — E119 Type 2 diabetes mellitus without complications: Secondary | ICD-10-CM

## 2010-10-16 LAB — HEPATIC FUNCTION PANEL
ALT: 24 U/L (ref 0–53)
AST: 20 U/L (ref 0–37)
Albumin: 4.3 g/dL (ref 3.5–5.2)
Alkaline Phosphatase: 55 U/L (ref 39–117)
Bilirubin, Direct: 0.1 mg/dL (ref 0.0–0.3)
Total Bilirubin: 0.7 mg/dL (ref 0.3–1.2)
Total Protein: 7 g/dL (ref 6.0–8.3)

## 2010-10-16 LAB — LIPID PANEL
Cholesterol: 166 mg/dL (ref 0–200)
HDL: 47.7 mg/dL (ref >39.00)
LDL Cholesterol: 107 mg/dL — ABNORMAL HIGH (ref 0–99)
Total CHOL/HDL Ratio: 3
Triglycerides: 56 mg/dL (ref 0.0–149.0)
VLDL: 11.2 mg/dL (ref 0.0–40.0)

## 2010-10-22 ENCOUNTER — Encounter: Payer: Self-pay | Admitting: Family Medicine

## 2010-10-23 ENCOUNTER — Ambulatory Visit (INDEPENDENT_AMBULATORY_CARE_PROVIDER_SITE_OTHER): Payer: 59 | Admitting: Family Medicine

## 2010-10-23 ENCOUNTER — Encounter: Payer: Self-pay | Admitting: Family Medicine

## 2010-10-23 DIAGNOSIS — E785 Hyperlipidemia, unspecified: Secondary | ICD-10-CM

## 2010-10-30 NOTE — Patient Instructions (Signed)
Continue current medications follow-up in one year 

## 2010-10-30 NOTE — Progress Notes (Signed)
  Subjective:    Patient ID: Travis Martinez, male    DOB: 1961/02/26, 50 y.o.   MRN: [id number]  Travis Martinez is a 50 year old male, who comes in today for follow-up of hyperlipidemia.  At his last physical exam his total cholesterol was 265.  On Zocor 20 mg a day.  His total dropped to 165 and his LDL dropped to 107.  Liver functions normal.  No side effects from medication.  He also takes an aspirin tablet daily    Review of Systems General and metabolic review of systems otherwise negative    Objective:   Physical Exam    Well-developed well-nourished, male in no acute distress    Assessment & Plan:  Hyperlipidemia,,,,,,,,, with respect to go on Zocor 20 mg nightly continue medication follow-up and annual physical

## 2010-11-20 ENCOUNTER — Other Ambulatory Visit: Payer: Self-pay | Admitting: Cardiovascular Disease

## 2010-11-24 ENCOUNTER — Other Ambulatory Visit: Payer: Self-pay | Admitting: Cardiology

## 2010-11-24 DIAGNOSIS — K219 Gastro-esophageal reflux disease without esophagitis: Secondary | ICD-10-CM

## 2010-11-24 MED ORDER — ESOMEPRAZOLE MAGNESIUM 40 MG PO CPDR: 40.0000 mg | DELAYED_RELEASE_CAPSULE | Freq: Every day | ORAL | Status: DC

## 2010-11-24 NOTE — Telephone Encounter (Signed)
Nexium 40 mg refilled. 

## 2010-12-09 NOTE — Assessment & Plan Note (Signed)
[facility]                             PULMONARY OFFICE NOTE   ARSALAN, BRISBIN                       MRN:          [id number]  DATE:05/17/2007                            DOB:          21-Apr-1961    I saw Mr. Broski in followup today for his severe obstructive sleep  apnea.   He is currently on CPAP at 7 cmH2O with nasal pillows and heated  humidifier.  He says he has noticed an improvement in his sleep quality  as well as his energy level during the day.  He will go to bed at around  11 o'clock at night.  He has no trouble falling asleep.  However, he  wakes up around 2 to 3 o'clock in the morning and then again at about 5  or 6 o'clock in the morning but he is usually able to go right back to  sleep.  He gets up in the morning between 7 and 7:30.  He says he will  occasionally have air leaking from his mouth.  He also sometimes wakes  up because his mask shifts around.  He has not tried any other masks for  a sustained period of time at home.  His medication list was reviewed  without any changes.   PHYSICAL EXAM:  He is 242 pounds, temperature 98.7, blood pressure  122/80, heart rate 72, oxygen saturation 92% on room air.  HEENT:  There is no sinus tenderness, no oral lesions.  HEART:  Regular S1 S2.  CHEST:  Clear to auscultation.  ABDOMEN:  Obese, soft, nontender.  EXTREMITIES:  No edema.   IMPRESSION:  Severe obstructive sleep apnea on CPAP of 7 centimeters  water.  What I will do is provide him with a chin strap and then monitor  to see if this improves his symptom status as well as his sleep quality  during the night.  If he is still having these problems, we may need to  consider doing an auto CPAP titration study at home to determine if he  needs any adjustments in his pressure setting as he may still be having  some residual events during his rapid eye movement sleep.   I will follow up with him in about 4 months.     Coralyn Helling, MD  Electronically Signed    VS/MedQ  DD: 05/17/2007  DT: 05/17/2007  Job #: [id number]   cc:   Tinnie Gens A. Tawanna Cooler, MD

## 2010-12-09 NOTE — Assessment & Plan Note (Signed)
[facility]                             PULMONARY OFFICE NOTE   Travis Martinez, Travis Martinez                       MRN:          [id number]  DATE:03/30/2007                            DOB:          Aug 03, 1960    I saw Travis Martinez today in followup for his sleep apnea.   He had undergone an overnight polysomnogram on February 01, 2007.  He  followed a split-night study protocol.   During the diagnostic portion of the test, he was found to have severe  obstructive sleep apnea with an apnea/hypopnea index of 40 and an oxygen  saturation rate of 81%.   During the therapeutic portion of the test, he was titrated to a CPAP  pressure setting of 7 cm of water with a reduction in his apnea/hypopnea  index to 0.6.  At this pressure setting, he was observed in both REM  sleep and supine sleep.   I have reviewed the results of his sleep study with him.  I have again  reviewed the adverse consequences of untreated sleep apnea, including  risk of hypertension, coronary artery disease, cerebrovascular disease,  diabetes, and arrhythmias.  I have, again, emphasized to him the  importance of diet, exercise, and weight reduction, as well as the  avoidance of alcohol and sedatives.  Driving precautions were reviewed  with him as well.  I have also discussed the various treatment options  for his sleep apnea, including CPAP therapy, oral appliance, and  surgical intervention.   Given the severity of the sleep apnea, I have advised him that his best  option would be to continue his CPAP therapy.  I will, therefore, start  him on CPAP at 7 cm of water with heated humidification.   I will then follow up with him in the office in approximately 6 to 8  weeks.     Coralyn Helling, MD  Electronically Signed    VS/MedQ  DD: 03/30/2007  DT: 03/30/2007  Job #: [id number]   cc:   Tinnie Gens A. Tawanna Cooler, MD

## 2010-12-09 NOTE — Procedures (Signed)
NAME:  Travis Martinez, Travis Martinez                ACCOUNT NO.:  [id number]   MEDICAL RECORD NO.:  [id number]          PATIENT TYPE:  OUT   LOCATION:  SLEEP CENTER                 FACILITY:  [facility]   PHYSICIAN:  Coralyn Helling, MD        DATE OF BIRTH:  [birth year]   DATE OF STUDY:  02/01/2007                            NOCTURNAL POLYSOMNOGRAM   REFERRING PHYSICIAN:  Coralyn Helling, MD   INDICATION FOR STUDY:  This is an individual who has a history of  hypertension as well as fatigue, excess in daytime sleepiness, and sleep  disruption.  He is referred to the sleep lab for evaluation of  hypersomnia with obstructive sleep apnea.   EPWORTH SLEEPINESS SCORE:  14.   MEDICATIONS:  Aspirin, hydrochlorothiazide.   SLEEP ARCHITECTURE:  Total recorded time was 452 minutes.  Total sleep  time was 404 minutes.  Sleep efficiency was 89%.  Sleep latency was 13.5  minutes.  REM latency was 99.5 minutes.  The patient was observed in all  stages of sleep, but had reduction in the percentage of slow-wave sleep  to 8% of the study.  The patient slept in both the supine and non-supine  positions.   RESPIRATORY DATA:  The average respiratory rate was 16.  The patient  followed a split-night study protocol.  During the diagnostic portion of  the study, he was found to have an apnea/hypopnea index of 40.4.  The  events were exclusively obstructive in nature.  Loud snoring was noted  by the technician.  During the therapeutic portion of the study, the  patient was titrated from a CPAP pressure setting of 5 to 7 cmH2O.  At a  CPAP pressure setting of 7 cmH2O, the apnea/hypopnea index was reduced  to 0.6.  At this pressure setting, the patient was observed in REM  sleep, supine sleep, and snoring was eliminated.   OXYGEN DATA:  The baseline oxygenation was 94%.  The oxygen saturation  nadir was 81%.  At a CPAP pressure setting of 7 cmH2O, the mean  oxygenation during non-REM sleep was 94%, the mean oxygenation during  REM  sleep was 93%, the minimal oxygenation during non-REM sleep was 88%,  and the minimal oxygenation during REM sleep was 90%.   CARDIAC DATA:  The rhythm strip showed normal sinus rhythm, with sinus  bradycardia.   MOVEMENT-PARASOMNIA:  The period limb movement index was 8.3.   IMPRESSIONS-RECOMMENDATIONS:  This was a split-night study protocol.  During the diagnostic portion of the study, the patient was found to  have severe obstructive sleep apnea, as demonstrated by an  apnea/hypopnea index of 40.4, with an oxygen saturation nadir of 81%.  During the therapeutic portion of the study, he was titrated  to a CPAP pressure setting of 7 cmH2O.  At this pressure setting, his  apnea/hypopnea index was reduced to 0.6, and he was observed in both REM  sleep and supine sleep at this pressure setting.      Coralyn Helling, MD  Diplomat, American Board of Sleep Medicine  Electronically Signed     VS/MEDQ  D:  02/09/2007 12:48:32  T:  02/10/2007 08:45:15  Job:  [id number]

## 2010-12-09 NOTE — Assessment & Plan Note (Signed)
[facility]                             PULMONARY OFFICE NOTE   Travis Martinez, Travis Martinez                       MRN:          [id number]  DATE:12/29/2006                            DOB:          [birth year]    REFERRING PHYSICIAN:  Tinnie Gens A. Tawanna Cooler, MD   SLEEP CONSULTATION:  I met Travis Martinez today for evaluation of his sleep  difficulties.   He has had this problem for several years.  His wife is concerned that  he may, in fact, have sleep apnea.  This is of particular concern given  his history of hypertension.  He says that he has also had problems with  waking up with sweats in the middle of the night, which has gotten  progressively worse.  His wife says that she has seen him gasp, as well  as snore loudly at night.  He has trouble sleeping on his back.  He  tends to breathe through his mouth while he is asleep and will  occasionally talk while he is asleep.  He goes to bed between 11 and 1  a.m.  He usually has no difficulty falling asleep.  Oftentimes, he will  actually lie down to take a nap in the evening, as well as taking naps  on weekends.  He sleeps through the night and wakes up at about 7:30 in  the morning during the work week.  He says it is extremely difficult for  him to get up and he often will have to hit the snooze button several  times.  He will sleep in until 10 or 11 o'clock in the morning on  weekends.  He says he occasionally has vivid dreams, but usually has  trouble remembering his dreams.  There is no history of sleep-walking.  He denies any symptoms of restless leg syndrome.  He has no history of  sleep hallucinations, sleep paralysis, cataplexy.  He does not use  anything to help him fall asleep at night.  He will occasionally drink  some coffee to help him stay awake during the day.   PAST MEDICAL HISTORY:  Significant for hypertension.   PAST SURGICAL HISTORY:  Significant for an appendectomy and  tonsillectomy.   CURRENT MEDICATIONS:  1. Hydrochlorothiazide 25 mg daily.  2. Aspirin 81 mg daily.   ALLERGIES:  He has no known drug allergies.   SOCIAL HISTORY:  He is married.  He has two children.  He works at [employer] in the [employer].  He quit smoking 30 years ago and has a  two pack-year history of smoking.  He quit drinking alcohol in January  of 2008.  He said he used to drink about a six-pack of beer on a daily  basis before this.   FAMILY HISTORY:  Significant for his mother, who has hypertension and  elevated cholesterol.  His father had hypertension, sleep apnea and  elevated cholesterol.   REVIEW OF SYSTEMS:  His Epworth score today is 9/24.  Also he had lost  approximately 40 pounds and said that his sleep had improved  considerably and that, as he had regained the weight, his sleep  difficulties got progressively worse.   PHYSICAL EXAM:  He is 5 feet 8 inches tall, 244 pounds.  Temperature is  98.6, blood pressure is 118/82, heart rate 71, oxygen saturation 94% on  room air.  HEENT:  Pupils are reactive.  There is no sinus tenderness.  He has a  septal deviation to the left with alar collapse with inhalation, more  prominent on the left.  He has a Mallampati 3 airway with decreased AP  diameter of the oropharynx.  There is no lymphadenopathy, no  thyromegaly.  HEART:  S1, S2, regular rate and rhythm.  CHEST:  Clear to auscultation.  ABDOMEN:  Obese, soft, nontender.  EXTREMITIES:  There is no edema, cyanosis or clubbing.  NEUROLOGIC EXAM:  No focal deficits were appreciated.   IMPRESSION:  1. He certainly has symptoms, as well as physical findings, which      would be consistent with sleep disordered breathing.  This is of      particular concern, given his history of hypertension.  I have      discussed with him the diagnosis of sleep apnea.  I had reviewed      with him the adverse consequences of untreated sleep apnea,      including risks of hypertension,  coronary disease, cerebrovascular      disease, diabetes and arrhythmias.  I discussed with him the      importance of diet, exercise and weight reduction, as well as      avoidance of alcohol and sedatives.  Driving precautions were      reviewed with him, as well.  To further evaluate this, I will make      arrangements for him to undergo an overnight polysomnogram.  I will      follow up with him after I have a chance to review his sleep study.  2. Nasal septal deviation:  I discussed with him that he could try      using BreatheRight strips to see if this helps with his symptoms of      nasal congestion.  If he does have sleep apnea and he is having      difficulty tolerating C-PAP therapy, then ENT evaluation may also      be warranted.     Coralyn Helling, MD  Electronically Signed    VS/MedQ  DD: 12/29/2006  DT: 12/29/2006  Job #: [id number]

## 2010-12-12 NOTE — Op Note (Signed)
NAMETONEY, Travis Martinez                          ACCOUNT NO.:  [id number]   MEDICAL RECORD NO.:  [id number]                   PATIENT TYPE:  INP   LOCATION:  5733                                 FACILITY:  [facility]   PHYSICIAN:  Angelia Mould. Derrell Lolling, M.D.             DATE OF BIRTH:  Mar 04, 1961   DATE OF PROCEDURE:  09/13/2002  DATE OF DISCHARGE:                                 OPERATIVE REPORT   PREOPERATIVE DIAGNOSIS:  Acute appendicitis.   POSTOPERATIVE DIAGNOSIS:  Acute appendicitis.   OPERATION PERFORMED:  Laparoscopic appendectomy.   SURGEON:  Angelia Mould. Derrell Lolling, M.D.   OPERATIVE INDICATION:  This is a 50 year old white man who presents with a  16-to-18-hour history of abdominal pain and nausea.  The pain has been  progressive and is now localized in the right lower quadrant.  On  examination, he is found to have a localized tenderness in the right lower  quadrant with direct and indirect rebound.  White blood cell count is  elevated slightly to 10,800 with a left shift and his urinalysis is normal.  He is brought to the operating room urgently for appendectomy.   OPERATIVE FINDINGS:  The appendix was acutely inflamed with some exudate on  it but was not ruptured, and there was no evidence of gangrene.  The  terminal ileum and cecum looked fine.  The sigmoid colon looked normal.  The  liver and gallbladder looked normal.   OPERATIVE TECHNIQUE:  Following the induction of general endotracheal  anesthesia, a Foley catheter and orogastric tube were inserted.  The abdomen  was prepped and draped in a sterile fashion.  Next, 0.5% Marcaine with  epinephrine was used as a local-infiltration anesthetic.  A vertically  oriented incision was made at the superior rim of the umbilicus.  The fascia  was incised in the midline and the abdominal cavity entered under direct  vision.  A 10-mm Hasson trocar was inserted and secured with a pursestring  suture of 0 Vicryl.  Pneumoperitoneum was  created.  The camera was inserted  with visualization and findings as described above.  A 5-mm trocar was  placed in the right upper quadrant and a 12-mm trocar was placed in the  suprapubic area.  We identified the appendix and identified the tip of the  appendix all the way back to its insertion on the cecum.  We controlled the  appendix with an Endoloop tie.  We took down the mesoappendix with the  harmonic scalpel.  We skeletonized the mesoappendix and mobilized the  appendix all the way back to its insertion on the cecum.  An Endo GI stapler  was then inserted and positioned across the base of the appendix, closed,  fired and removed.  This provided a very secure transection of the appendix  and a very secure staple line on the cecum.  The appendix was placed in a  specimen bag and  removed.   The operative field was irrigated with saline.  There was no bleeding and no  purulent fluid left.  The staple line was again inspected and looked fine.  There were no loose staples.  The trocars were removed under direct vision,  and there was no bleeding from the trocar sites.  The pneumoperitoneum was  released.  The fascia at the umbilicus and the fascia in the suprapubic port  were closed with 0 Vicryl sutures.  The wounds were irrigated with saline.  The skin  was closed with subcuticular sutures of 4-0 Vicryl and Steri-Strips.  Clean  bandages were placed.  The patient was taken to the recovery room in stable  condition.  Estimated blood loss was about 15-20 mL.  Complications:  None.  Sponge, needle and instrument counts were correct.                                               Angelia Mould. Derrell Lolling, M.D.    HMI/MEDQ  D:  09/13/2002  T:  09/14/2002  Job:  [id number]   cc:   Tinnie Gens A. Tawanna Cooler, M.D. [facility]

## 2010-12-12 NOTE — H&P (Signed)
NAME:  Travis Martinez, Travis Martinez                          ACCOUNT NO.:  [id number]   MEDICAL RECORD NO.:  [id number]                   PATIENT TYPE:  EMS   LOCATION:  [facility]                                 FACILITY:  [facility]   PHYSICIAN:  Angelia Mould. Derrell Lolling, M.D.             DATE OF BIRTH:  01/11/1961   DATE OF ADMISSION:  09/13/2002  DATE OF DISCHARGE:                                HISTORY & PHYSICAL   CHIEF COMPLAINT:  Abdominal pain.   HISTORY OF PRESENT ILLNESS:  This is a 50 year old white man who was  previously well. He presents with a 16 to 18 hour history of abdominal pain  and nausea. The pain began centrally but is now a bit more severe and now in  the right lower quadrant. He had one soft mushy stool, no blood in the  stool. Denies fever or chills. No prior episodes. No prior GI problems. He  was seen by Dr. Kelle Darting who felt that he had appendicitis and was sent  to the [facility] emergency room for my evaluation.   PAST MEDICAL HISTORY:  1. He has had some gastroesophageal reflux disease symptoms in the past     which are intermittent.  2. He had tonsillectomy as a child.  3. He had pneumonia as a child.  4. He has otherwise been healthy.   CURRENT MEDICATIONS:  Afrin spray occasionally.   ALLERGIES:  None known.   SOCIAL HISTORY:  The patient is married, has two children. 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 Denies use of alcohol or tobacco  although he used to drink.   FAMILY HISTORY:  Mother living, has arthritis and bipolar disorder. Father  living, has arrhythmia. Denies family history of major GI disease, cancer,  or diabetes.   REVIEW OF SYMPTOMS:  The patient has had a 30-pound weight loss over the  past six weeks intentionally on a diet. Otherwise, all systems are reviewed  and are noncontributory except as described above.   PHYSICAL EXAMINATION:  GENERAL:  Very pleasant, young man, slightly  overweight, in moderate distress from abdominal  pain.  VITAL SIGNS:  Temperature 97.3, pulse 72, respirations 20, blood pressure  112/90.  EYES:  Sclerae clear. Extraocular movements intact.  ENT:  External auditory canals clear. Nose clear. Lips, tongue, and  oropharynx look normal.  NECK:  Supple, nontender, no mass. Thyroid not enlarged. No thyroid mass. No  adenopathy. No jugular venous distention.  LUNGS:  Clear to auscultation.  HEART:  Regular rate and rhythm. No murmur.  ABDOMEN:  Soft but tender in the right lower quadrant. He has voluntary and  involuntary direct and indirect rebound in the right lower quadrant. I do  not feel a mass. Liver and spleen are not enlarged. Bowel sounds are  diminished. He does not seem distended.  GENITOURINARY:  Penis, scrotum, and testes feel normal. I do not feel any  hernia or adenopathy in the groin.  RECTAL:  Deferred.  EXTREMITIES:  Good range of motion. No deformity. No edema. Good pulses.  NEUROLOGICAL:  No gross motor or sensory deficits.   ADMISSION LABORATORY DATA:  Urinalysis is normal. CBC pending.   IMPRESSION:  Acute appendicitis.   PLAN:  We will await the results of his CBC, but it is almost certain that  he will need to go to the operating room for diagnostic laparoscopy and  appendectomy.   I have discussed the indications and details of surgery with the patient and  his wife. Risks and complications have been outlined, including but not  limited to bleeding, infection, conversion to open laparotomy, error in  diagnosis which might include diverticulitis, Meckel's diverticulum, Crohn's  disease, viral gastroenteritis, or other unforeseen problems which may  necessitate a more extensive laparotomy. Wound problems such as infection or  hernia are discussed. He seems to understand all of these issues well. At  this time, all of his questions are answered. He is in agreement with this  plan.                                               Angelia Mould. Derrell Lolling, M.D.     HMI/MEDQ  D:  09/13/2002  T:  09/13/2002  Job:  [id number]   cc:   Tinnie Gens A. Tawanna Cooler, M.D. [facility]

## 2011-01-17 ENCOUNTER — Emergency Department (HOSPITAL_COMMUNITY)
Admission: EM | Admit: 2011-01-17 | Discharge: 2011-01-18 | Disposition: A | Discharge status: Home / Self Care | Payer: 59 | Attending: Emergency Medicine | Admitting: Emergency Medicine

## 2011-01-17 ENCOUNTER — Emergency Department (HOSPITAL_COMMUNITY): Payer: 59

## 2011-01-17 DIAGNOSIS — S2249XA Multiple fractures of ribs, unspecified side, initial encounter for closed fracture: Secondary | ICD-10-CM | POA: Insufficient documentation

## 2011-01-17 DIAGNOSIS — I10 Essential (primary) hypertension: Secondary | ICD-10-CM | POA: Insufficient documentation

## 2011-01-17 DIAGNOSIS — M545 Low back pain, unspecified: Secondary | ICD-10-CM | POA: Insufficient documentation

## 2011-01-17 DIAGNOSIS — E7800 Pure hypercholesterolemia, unspecified: Secondary | ICD-10-CM | POA: Insufficient documentation

## 2011-01-17 DIAGNOSIS — S32009A Unspecified fracture of unspecified lumbar vertebra, initial encounter for closed fracture: Secondary | ICD-10-CM | POA: Insufficient documentation

## 2011-01-17 DIAGNOSIS — R079 Chest pain, unspecified: Secondary | ICD-10-CM | POA: Insufficient documentation

## 2011-01-17 LAB — POCT I-STAT, CHEM 8
BUN: 24 mg/dL — ABNORMAL HIGH (ref 6–23)
Calcium, Ion: 1.11 mmol/L — ABNORMAL LOW (ref 1.12–1.32)
Chloride: 106 mEq/L (ref 96–112)
Creatinine, Ser: 1.2 mg/dL (ref 0.50–1.35)
Glucose, Bld: 155 mg/dL — ABNORMAL HIGH (ref 70–99)
HCT: 47 % (ref 39.0–52.0)
Hemoglobin: 16 g/dL (ref 13.0–17.0)
Potassium: 3.2 mEq/L — ABNORMAL LOW (ref 3.5–5.1)
Sodium: 140 mEq/L (ref 135–145)
TCO2: 23 mmol/L (ref 0–100)

## 2011-01-17 MED ORDER — IOHEXOL 300 MG/ML  SOLN: 125.0000 mL | Freq: Once | INTRAMUSCULAR | Status: AC | PRN

## 2011-01-18 MED ORDER — IOHEXOL 300 MG/ML  SOLN
125.0000 mL | Freq: Once | INTRAMUSCULAR | Status: AC | PRN
  Administered 2011-01-18: 125 mL via INTRAVENOUS

## 2011-01-20 ENCOUNTER — Encounter: Payer: Self-pay | Admitting: Family Medicine

## 2011-01-20 ENCOUNTER — Ambulatory Visit (INDEPENDENT_AMBULATORY_CARE_PROVIDER_SITE_OTHER): Payer: 59 | Admitting: Family Medicine

## 2011-01-20 VITALS — BP 120/84 | Temp 98.4°F | Wt 230.0 lb

## 2011-01-20 DIAGNOSIS — S2239XA Fracture of one rib, unspecified side, initial encounter for closed fracture: Secondary | ICD-10-CM

## 2011-01-20 DIAGNOSIS — S32009A Unspecified fracture of unspecified lumbar vertebra, initial encounter for closed fracture: Secondary | ICD-10-CM

## 2011-01-20 MED ORDER — OXYCODONE-ACETAMINOPHEN 5-325 MG PO TABS: ORAL_TABLET | ORAL | Status: DC

## 2011-01-20 NOTE — Patient Instructions (Signed)
Take one half to one tablet of the oxycodone 3 to 4 times a day as needed for pain.  Return in two weeks for follow-up
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
## 2011-11-03 ENCOUNTER — Other Ambulatory Visit: Payer: Self-pay | Admitting: *Deleted

## 2011-11-03 MED ORDER — SIMVASTATIN 20 MG PO TABS: 20.0000 mg | ORAL_TABLET | Freq: Every day | ORAL | Status: DC

## 2011-11-03 MED ORDER — HYDROCHLOROTHIAZIDE 25 MG PO TABS: 25.0000 mg | ORAL_TABLET | Freq: Every day | ORAL | Status: DC

## 2012-03-03 ENCOUNTER — Other Ambulatory Visit: Payer: Self-pay | Admitting: Family Medicine

## 2012-03-03 ENCOUNTER — Telehealth: Payer: Self-pay | Admitting: Family Medicine

## 2012-03-03 NOTE — Telephone Encounter (Signed)
rx sent

## 2012-03-03 NOTE — Telephone Encounter (Signed)
Patient called stating that he need a refill of his HCTZ and zocor these refills have run out Pharmacy is [facility]. Please assist.

## 2012-04-13 ENCOUNTER — Other Ambulatory Visit (INDEPENDENT_AMBULATORY_CARE_PROVIDER_SITE_OTHER): Payer: 59

## 2012-04-13 DIAGNOSIS — R5381 Other malaise: Secondary | ICD-10-CM

## 2012-04-13 DIAGNOSIS — R5383 Other fatigue: Secondary | ICD-10-CM

## 2012-04-13 DIAGNOSIS — Z0000 Encounter for general adult medical examination without abnormal findings: Secondary | ICD-10-CM

## 2012-04-13 LAB — CBC WITH DIFFERENTIAL/PLATELET
Basophils Absolute: 0 10*3/uL (ref 0.0–0.1)
Basophils Relative: 0.8 % (ref 0.0–3.0)
Eosinophils Relative: 5.7 % — ABNORMAL HIGH (ref 0.0–5.0)
HCT: 47.8 % (ref 39.0–52.0)
Hemoglobin: 16.3 g/dL (ref 13.0–17.0)
Lymphocytes Relative: 25.5 % (ref 12.0–46.0)
Lymphs Abs: 1.5 10*3/uL (ref 0.7–4.0)
Monocytes Relative: 9.4 % (ref 3.0–12.0)
Neutro Abs: 3.4 10*3/uL (ref 1.4–7.7)
RBC: 5.4 Mil/uL (ref 4.22–5.81)
RDW: 13.1 % (ref 11.5–14.6)

## 2012-04-13 LAB — TESTOSTERONE: Testosterone: 249.59 ng/dL — ABNORMAL LOW (ref 350.00–890.00)

## 2012-04-13 LAB — POCT URINALYSIS DIPSTICK
Bilirubin, UA: NEGATIVE
Glucose, UA: NEGATIVE
Leukocytes, UA: NEGATIVE
Nitrite, UA: NEGATIVE
Urobilinogen, UA: 0.2
pH, UA: 6

## 2012-04-13 LAB — BASIC METABOLIC PANEL
Calcium: 9.8 mg/dL (ref 8.4–10.5)
GFR: 68.61 mL/min (ref >60.00)
Glucose, Bld: 114 mg/dL — ABNORMAL HIGH (ref 70–99)
Potassium: 4.3 mEq/L (ref 3.5–5.1)
Sodium: 142 mEq/L (ref 135–145)

## 2012-04-13 LAB — LIPID PANEL
HDL: 37.5 mg/dL — ABNORMAL LOW (ref >39.00)
LDL Cholesterol: 113 mg/dL — ABNORMAL HIGH (ref 0–99)
Total CHOL/HDL Ratio: 5
VLDL: 37 mg/dL (ref 0.0–40.0)

## 2012-04-13 LAB — HEPATIC FUNCTION PANEL
ALT: 44 U/L (ref 0–53)
Bilirubin, Direct: 0.1 mg/dL (ref 0.0–0.3)
Total Bilirubin: 0.8 mg/dL (ref 0.3–1.2)
Total Protein: 7.1 g/dL (ref 6.0–8.3)

## 2012-04-13 LAB — PSA: PSA: 1.46 ng/mL (ref 0.10–4.00)

## 2012-04-13 LAB — HEMOGLOBIN A1C: Hgb A1c MFr Bld: 5.7 % (ref 4.6–6.5)

## 2012-04-18 ENCOUNTER — Ambulatory Visit (INDEPENDENT_AMBULATORY_CARE_PROVIDER_SITE_OTHER): Payer: 59 | Admitting: Family Medicine

## 2012-04-18 ENCOUNTER — Encounter: Payer: Self-pay | Admitting: Family Medicine

## 2012-04-18 DIAGNOSIS — I10 Essential (primary) hypertension: Secondary | ICD-10-CM

## 2012-04-18 DIAGNOSIS — E669 Obesity, unspecified: Secondary | ICD-10-CM

## 2012-04-18 DIAGNOSIS — G4730 Sleep apnea, unspecified: Secondary | ICD-10-CM

## 2012-04-18 DIAGNOSIS — R7309 Other abnormal glucose: Secondary | ICD-10-CM

## 2012-04-18 DIAGNOSIS — K439 Ventral hernia without obstruction or gangrene: Secondary | ICD-10-CM

## 2012-04-18 DIAGNOSIS — Z0000 Encounter for general adult medical examination without abnormal findings: Secondary | ICD-10-CM

## 2012-04-18 DIAGNOSIS — E785 Hyperlipidemia, unspecified: Secondary | ICD-10-CM

## 2012-04-18 MED ORDER — SIMVASTATIN 20 MG PO TABS: 20.0000 mg | ORAL_TABLET | Freq: Every day | ORAL | Status: DC

## 2012-04-18 MED ORDER — FUROSEMIDE 20 MG PO TABS: 20.0000 mg | ORAL_TABLET | Freq: Every day | ORAL | Status: DC

## 2012-04-18 NOTE — Progress Notes (Signed)
Subjective:    Patient ID: Travis Martinez, male    DOB: June 22, 1961, 51 y.o.   MRN: [id number]  HPI Travis Martinez is a playful 51 year old married male nonsmoker who comes in today for general physical examination because of a history of reflux esophagitis, peripheral edema, hyperlipidemia, sleep apnea, and ventral hernia, right and left knee pain, and a history of sleep apnea and obesity  His medications are unchanged except he stopped the Nexium his reflux is quiet. He has not been using his CPAP machine is over 1 years old. He did do very well and lost 50 pounds his breathing didn't much improved however he is gaining his weight back. Current weight 266 pounds  For 2 months had right and left knee pain no history of trauma. He's also has an incisional hernia this started out around his umbilicus it is painless however this bulging at times.  He works in the health system in the computer division  He gets routine eye care, dental care, due for colonoscopy since he is 51, tetanus 2008,   Review of Systems  Constitutional: Negative.   HENT: Negative.   Eyes: Negative.   Respiratory: Negative.   Cardiovascular: Negative.   Gastrointestinal: Negative.   Genitourinary: Negative.   Musculoskeletal: Negative.   Skin: Negative.   Neurological: Negative.   Hematological: Negative.   Psychiatric/Behavioral: Negative.        Objective:   Physical Exam  Constitutional: He is oriented to person, place, and time. He appears well-developed and well-nourished.  HENT:  Head: Normocephalic and atraumatic.  Right Ear: External ear normal.  Left Ear: External ear normal.  Nose: Nose normal.  Mouth/Throat: Oropharynx is clear and moist.  Eyes: Conjunctivae normal and EOM are normal. Pupils are equal, round, and reactive to light.  Neck: Normal range of motion. Neck supple. No JVD present. No tracheal deviation present. No thyromegaly present.  Cardiovascular: Normal rate, regular rhythm, normal heart  sounds and intact distal pulses.  Exam reveals no gallop and no friction rub.   No murmur heard. Pulmonary/Chest: Effort normal and breath sounds normal. No stridor. No respiratory distress. He has no wheezes. He has no rales. He exhibits no tenderness.  Abdominal: Soft. Bowel sounds are normal. He exhibits no distension and no mass. There is no tenderness. There is no rebound and no guarding.       Ventral hernia extending from the umbilicus to the xiphoid  Genitourinary: Rectum normal, prostate normal and penis normal. Guaiac negative stool. No penile tenderness.  Musculoskeletal: Normal range of motion. He exhibits no edema and no tenderness.       Both knees appear normal they're not warm ligaments and cartilages are intact.  Lymphadenopathy:    He has no cervical adenopathy.  Neurological: He is alert and oriented to person, place, and time. He has normal reflexes. No cranial nerve deficit. He exhibits normal muscle tone.  Skin: Skin is warm and dry. No rash noted. No erythema. No pallor.  Psychiatric: He has a normal mood and affect. His behavior is normal. Judgment and thought content normal.          Assessment & Plan:  Healthy male  Peripheral edema changed to Lasix 20 daily  Pain right and left knee Motrin 600 twice a day elevation and ice when necessary  Hyperlipidemia continue Zocor 20 mg of aspirin tablet  Incisional hernia observe  Overweight ,,,,,,,, encouraged to go back and try the diet exercise and weight loss  Sleep apnea reconsult  with Dr. Mellody Dance C.  Return in one year sooner if any problems

## 2012-04-18 NOTE — Patient Instructions (Signed)
Stop the hydrochlorothiazide  Start Lasix 20 mg daily in a.m.  Resume your weight loss program  Call Dr. Mellody Dance if you would like to pursue the CPAP  Continue the Zocor and aspirin  For your knee pain I would recommend Motrin 600 mg twice daily with food elevation ice in the evening  Return in one year sooner if any problems  I will also put a note to GI for a screening colonoscopy

## 2012-04-25 ENCOUNTER — Encounter: Payer: Self-pay | Admitting: Internal Medicine

## 2012-05-03 ENCOUNTER — Ambulatory Visit (AMBULATORY_SURGERY_CENTER): Payer: 59

## 2012-05-03 VITALS — Ht 69.0 in | Wt 264.4 lb

## 2012-05-03 DIAGNOSIS — Z1211 Encounter for screening for malignant neoplasm of colon: Secondary | ICD-10-CM

## 2012-05-03 DIAGNOSIS — Z8371 Family history of colonic polyps: Secondary | ICD-10-CM

## 2012-05-03 MED ORDER — NA SULFATE-K SULFATE-MG SULF 17.5-3.13-1.6 GM/177ML PO SOLN: 1.0000 | Freq: Once | ORAL | Status: DC

## 2012-05-16 ENCOUNTER — Encounter: Payer: 59 | Admitting: Internal Medicine

## 2012-05-30 ENCOUNTER — Ambulatory Visit (AMBULATORY_SURGERY_CENTER): Payer: 59 | Admitting: Internal Medicine

## 2012-05-30 ENCOUNTER — Encounter: Payer: Self-pay | Admitting: Internal Medicine

## 2012-05-30 VITALS — BP 136/94 | HR 65 | Temp 99.1°F | Resp 28 | Ht 69.0 in | Wt 264.0 lb

## 2012-05-30 DIAGNOSIS — Z8371 Family history of colonic polyps: Secondary | ICD-10-CM

## 2012-05-30 DIAGNOSIS — Z1211 Encounter for screening for malignant neoplasm of colon: Secondary | ICD-10-CM

## 2012-05-30 DIAGNOSIS — K635 Polyp of colon: Secondary | ICD-10-CM

## 2012-05-30 DIAGNOSIS — D126 Benign neoplasm of colon, unspecified: Secondary | ICD-10-CM

## 2012-05-30 DIAGNOSIS — Z83719 Family history of colon polyps, unspecified: Secondary | ICD-10-CM

## 2012-05-30 MED ORDER — SODIUM CHLORIDE 0.9 % IV SOLN: 500.0000 mL | INTRAVENOUS | Status: DC

## 2012-05-30 NOTE — Op Note (Signed)
[facility] [address] [place], [address]   COLONOSCOPY PROCEDURE REPORT  PATIENT: Travis Martinez, Travis Martinez  MR#: [id number] BIRTHDATE: 02-12-1961 , 51  yrs. old GENDER: Male ENDOSCOPIST: Beverley Fiedler, MD REFERRED XB:JYNW, Eugenio Hoes. PROCEDURE DATE:  05/30/2012 PROCEDURE:   Colonoscopy with snare polypectomy ASA CLASS:   Class III INDICATIONS:average risk screening and first colonoscopy. MEDICATIONS: MAC sedation, administered by CRNA and Propofol (Diprivan) 520 mg IV  DESCRIPTION OF PROCEDURE:   After the risks benefits and alternatives of the procedure were thoroughly explained, informed consent was obtained.  A digital rectal exam revealed no rectal mass.   The LB CF-[id number]  endoscope was introduced through the anus and advanced to the cecum, which was identified by both the appendix and ileocecal valve. No adverse events experienced. The quality of the prep was Suprep good  The instrument was then slowly withdrawn as the colon was fully examined.   COLON FINDINGS: Two sessile polyps measuring 3-6 mm in size were found in the transverse colon.  A polypectomy was performed with a cold snare.  The resection was complete and the polyp tissue was completely retrieved.   Three sessile polyps measuring 3-9 mm in size were found in the descending colon.  Polypectomy was performed using cold snare (2) and using hot snare (1).  All resections were complete and all polyp tissue was completely retrieved.   Five polyps ranging between 3-62mm in size were found in the rectosigmoid colon and rectum.  Polypectomy was performed using cold snare (4)and using hot snare (1).  All resections were complete and all polyp tissue was completely retrieved.  Retroflexed views revealed no abnormalities. The time to cecum=3 minutes 55 seconds. Withdrawal time=23 minutes 15 seconds.  The scope was withdrawn and the procedure completed.  COMPLICATIONS: There were no  complications.  ENDOSCOPIC IMPRESSION: 1.   Two sessile polyps measuring 3-6 mm in size were found in the transverse colon; polypectomy was performed with a cold snare 2.   Three sessile polyps measuring 3-9 mm in size were found in the descending colon; Polypectomy was performed using cold snare and using hot snare 3.   Five polyps ranging between 3-65mm in size were found in the rectosigmoid colon and rectum; Polypectomy was performed using cold snare and using hot snare  RECOMMENDATIONS: 1.  Hold aspirin, aspirin products, and anti-inflammatory medication for 1 week. 2.  Await pathology results 3.  Timing of repeat colonoscopy will be determined by pathology findings. 4.  You will receive a letter within 1-2 weeks with the results of your biopsy as well as final recommendations.  Please call my office if you have not received a letter after 3 weeks.   eSigned:  Beverley Fiedler, MD 05/30/2012 2:23 PM   cc: Roderick Pee, MD and The Patient   PATIENT NAME:  Travis Martinez, Travis Martinez MR#: [id number]

## 2012-05-30 NOTE — Progress Notes (Addendum)
Patient did not have preoperative order for IV antibiotic SSI prophylaxis. (G8918)  Patient did not experience any of the following events: a burn prior to discharge; a fall within the facility; wrong site/side/patient/procedure/implant event; or a hospital transfer or hospital admission upon discharge from the facility. (G8907)  

## 2012-05-30 NOTE — Patient Instructions (Addendum)

## 2012-05-31 ENCOUNTER — Telehealth: Payer: Self-pay | Admitting: *Deleted

## 2012-05-31 NOTE — Telephone Encounter (Signed)
  Follow up Call-  Call back number 05/30/2012  Post procedure Call Back phone  # [phone]  Permission to leave phone message Yes     Patient questions:  Do you have a fever, pain , or abdominal swelling? no Pain Score  2 *  Have you tolerated food without any problems? no  Have you been able to return to your normal activities? yes  Do you have any questions about your discharge instructions: Diet   no Medications  no Follow up visit  no  Do you have questions or concerns about your Care? no  Actions: * If pain score is 4 or above: No action needed, pain <4.

## 2012-06-02 ENCOUNTER — Encounter: Payer: Self-pay | Admitting: Internal Medicine

## 2012-09-10 ENCOUNTER — Other Ambulatory Visit: Payer: Self-pay

## 2012-12-13 ENCOUNTER — Encounter: Payer: Self-pay | Admitting: Family Medicine

## 2013-06-01 ENCOUNTER — Other Ambulatory Visit: Payer: Self-pay

## 2013-07-28 ENCOUNTER — Emergency Department (HOSPITAL_COMMUNITY)
Admission: EM | Admit: 2013-07-28 | Discharge: 2013-07-28 | Disposition: A | Discharge status: Home / Self Care | Payer: 59 | Source: Home / Self Care | Attending: Family Medicine | Admitting: Family Medicine

## 2013-07-28 ENCOUNTER — Encounter (HOSPITAL_COMMUNITY): Payer: Self-pay | Admitting: Emergency Medicine

## 2013-07-28 DIAGNOSIS — J019 Acute sinusitis, unspecified: Secondary | ICD-10-CM

## 2013-07-28 MED ORDER — AMOXICILLIN-POT CLAVULANATE 875-125 MG PO TABS
1.0000 | ORAL_TABLET | Freq: Two times a day (BID) | ORAL | Status: DC
Start: 2013-07-28 — End: 2013-08-22

## 2013-07-28 NOTE — Discharge Instructions (Signed)
Drink plenty of fluids as discussed, use medicine as prescribed, and mucinex and saline nose spray and humidifier as discussed. Return or see your doctor if further problems

## 2013-07-28 NOTE — ED Provider Notes (Signed)
CSN: [id number]     Arrival date & time 07/28/13  1003 History   First MD Initiated Contact with Patient 07/28/13 1103     Chief Complaint  Patient presents with  . URI   (Consider location/radiation/quality/duration/timing/severity/associated sxs/prior Treatment) Patient is a 53 y.o. male presenting with URI. The history is provided by the patient.  URI Presenting symptoms: congestion, cough and rhinorrhea   Presenting symptoms: no fever   Severity:  Mild Onset quality:  Gradual Duration:  5 days Progression:  Unchanged Chronicity:  New Ineffective treatments:  OTC medications Associated symptoms: sinus pain   Associated symptoms: no wheezing   Risk factors: sick contacts     Past Medical History  Diagnosis Date  . PVC (premature ventricular contraction)   . Hypertension   . Obesity   . Glucose intolerance (malabsorption)   . Sleep apnea   . Umbilical hernia   . Abnormal glucose   . ED (erectile dysfunction) of organic origin   . Hyperlipidemia   . Arthritis    Past Surgical History  Procedure Laterality Date  . Appendectomy    . Tonsilectomy, adenoidectomy, bilateral myringotomy and tubes     Family History  Problem Relation Age of Onset  . Bipolar disorder Mother   . Arthritis Mother   . Colon polyps Mother   . Intracerebral hemorrhage    . Colon polyps Father   . Colon cancer Neg Hx   . Esophageal cancer Neg Hx   . Rectal cancer Neg Hx   . Stomach cancer Neg Hx    History  Substance Use Topics  . Smoking status: Never Smoker   . Smokeless tobacco: Never Used  . Alcohol Use: 1.2 - 3.6 oz/week    2-6 Cans of beer per week    Review of Systems  Constitutional: Negative.  Negative for fever.  HENT: Positive for congestion, postnasal drip, rhinorrhea and sinus pressure.   Respiratory: Positive for cough. Negative for wheezing.   Gastrointestinal: Negative.     Allergies  Review of patient's allergies indicates no known allergies.  Home Medications    Current Outpatient Rx  Name  Route  Sig  Dispense  Refill  . amoxicillin-clavulanate (AUGMENTIN) 875-125 MG per tablet   Oral   Take 1 tablet by mouth 2 (two) times daily.   20 tablet   0   . aspirin 81 MG tablet   Oral   Take 81 mg by mouth daily.           Marland Kitchen esomeprazole (NEXIUM) 40 MG capsule   Oral   Take 1 capsule (40 mg total) by mouth daily before breakfast.   30 capsule   3   . furosemide (LASIX) 20 MG tablet   Oral   Take 1 tablet (20 mg total) by mouth daily.   100 tablet   3   . glucosamine-chondroitin 500-400 MG tablet   Oral   Take 1 tablet by mouth daily.         . Ibuprofen (ADVIL) 200 MG CAPS   Oral   Take by mouth. Take 600 mg -800 mg daily         . Multiple Vitamin (MULTIVITAMIN) tablet   Oral   Take 1 tablet by mouth daily.         Marland Kitchen oxyCODONE-acetaminophen (PERCOCET) 5-325 MG per tablet      One half to one tablet 3 to 4 times a day as needed for pain   100 tablet  0   . simvastatin (ZOCOR) 20 MG tablet   Oral   Take 1 tablet (20 mg total) by mouth at bedtime.   100 tablet   3     Office visit for more refills   . traMADol (ULTRAM) 50 MG tablet   Oral   Take 50 mg by mouth every 6 (six) hours as needed. 1-2 tabs           There were no vitals taken for this visit. Physical Exam  Nursing note and vitals reviewed. Constitutional: He is oriented to person, place, and time. He appears well-developed and well-nourished.  HENT:  Head: Normocephalic.  Right Ear: External ear normal.  Left Ear: External ear normal.  Nose: Mucosal edema and rhinorrhea present. Right sinus exhibits maxillary sinus tenderness. Left sinus exhibits maxillary sinus tenderness.  Mouth/Throat: Oropharynx is clear and moist.  Neck: Normal range of motion. Neck supple.  Cardiovascular: Normal rate, regular rhythm, normal heart sounds and intact distal pulses.   Pulmonary/Chest: Effort normal and breath sounds normal.  Lymphadenopathy:    He has no  cervical adenopathy.  Neurological: He is alert and oriented to person, place, and time.  Skin: Skin is warm and dry.    ED Course  Procedures (including critical care time) Labs Review Labs Reviewed - No data to display Imaging Review No results found.  EKG Interpretation    Date/Time:    Ventricular Rate:    PR Interval:    QRS Duration:   QT Interval:    QTC Calculation:   R Axis:     Text Interpretation:              MDM      Billy Fischer, MD 07/28/13 [phone]

## 2013-07-28 NOTE — ED Notes (Signed)
Pt  Reports      Symptoms  Of         Congested  Cough             persistant   -  Symptoms  Not releived  By  otc meds -  Afrin    Sitting  Upright on  Exam table  Speaking in  Complete  sentances      Cough  Is  For the  Most  Part  Non  Productive

## 2013-08-05 ENCOUNTER — Telehealth: Payer: Self-pay | Admitting: Family Medicine

## 2013-08-05 NOTE — Telephone Encounter (Signed)
[facility] [place] - [address] requesting refill for:  furosemide (LASIX) 20 MG tablet #90, last filled 04/07/13  simvastatin (ZOCOR) 20 MG tablet #90, last filled 04/07/13

## 2013-08-07 NOTE — Telephone Encounter (Signed)
Rx has been denied.  Patient needs an office visit.  Pharmacy is aware.

## 2013-08-15 ENCOUNTER — Other Ambulatory Visit (INDEPENDENT_AMBULATORY_CARE_PROVIDER_SITE_OTHER): Payer: 59

## 2013-08-15 DIAGNOSIS — Z0000 Encounter for general adult medical examination without abnormal findings: Secondary | ICD-10-CM

## 2013-08-15 LAB — CBC WITH DIFFERENTIAL/PLATELET
BASOS ABS: 0 10*3/uL (ref 0.0–0.1)
Basophils Relative: 0.5 % (ref 0.0–3.0)
EOS ABS: 0.5 10*3/uL (ref 0.0–0.7)
Eosinophils Relative: 7.6 % — ABNORMAL HIGH (ref 0.0–5.0)
HEMATOCRIT: 45.1 % (ref 39.0–52.0)
Hemoglobin: 15.7 g/dL (ref 13.0–17.0)
LYMPHS ABS: 1.6 10*3/uL (ref 0.7–4.0)
Lymphocytes Relative: 24.9 % (ref 12.0–46.0)
MCHC: 34.8 g/dL (ref 30.0–36.0)
MCV: 86.9 fl (ref 78.0–100.0)
MONOS PCT: 8 % (ref 3.0–12.0)
Monocytes Absolute: 0.5 10*3/uL (ref 0.1–1.0)
Neutro Abs: 3.8 10*3/uL (ref 1.4–7.7)
Neutrophils Relative %: 59 % (ref 43.0–77.0)
PLATELETS: 197 10*3/uL (ref 150.0–400.0)
RBC: 5.19 Mil/uL (ref 4.22–5.81)
RDW: 13.1 % (ref 11.5–14.6)
WBC: 6.4 10*3/uL (ref 4.5–10.5)

## 2013-08-15 LAB — POCT URINALYSIS DIPSTICK
BILIRUBIN UA: NEGATIVE
GLUCOSE UA: NEGATIVE
KETONES UA: NEGATIVE
Leukocytes, UA: NEGATIVE
Nitrite, UA: NEGATIVE
Protein, UA: NEGATIVE
RBC UA: NEGATIVE
UROBILINOGEN UA: 0.2
pH, UA: 5.5

## 2013-08-15 LAB — BASIC METABOLIC PANEL
BUN: 20 mg/dL (ref 6–23)
CALCIUM: 9.4 mg/dL (ref 8.4–10.5)
CO2: 26 meq/L (ref 19–32)
CREATININE: 1 mg/dL (ref 0.4–1.5)
Chloride: 107 mEq/L (ref 96–112)
GFR: 81.54 mL/min (ref >60.00)
GLUCOSE: 110 mg/dL — AB (ref 70–99)
Potassium: 4.5 mEq/L (ref 3.5–5.1)
Sodium: 141 mEq/L (ref 135–145)

## 2013-08-15 LAB — PSA: PSA: 2.52 ng/mL (ref 0.10–4.00)

## 2013-08-15 LAB — LIPID PANEL
CHOLESTEROL: 195 mg/dL (ref 0–200)
HDL: 43.7 mg/dL (ref >39.00)
Total CHOL/HDL Ratio: 4
Triglycerides: 256 mg/dL — ABNORMAL HIGH (ref 0.0–149.0)
VLDL: 51.2 mg/dL — ABNORMAL HIGH (ref 0.0–40.0)

## 2013-08-15 LAB — HEPATIC FUNCTION PANEL
ALBUMIN: 4.3 g/dL (ref 3.5–5.2)
ALT: 35 U/L (ref 0–53)
AST: 25 U/L (ref 0–37)
Alkaline Phosphatase: 64 U/L (ref 39–117)
BILIRUBIN TOTAL: 0.5 mg/dL (ref 0.3–1.2)
Bilirubin, Direct: 0 mg/dL (ref 0.0–0.3)
TOTAL PROTEIN: 7.3 g/dL (ref 6.0–8.3)

## 2013-08-15 LAB — LDL CHOLESTEROL, DIRECT: LDL DIRECT: 132.6 mg/dL

## 2013-08-15 LAB — TSH: TSH: 3.2 u[IU]/mL (ref 0.35–5.50)

## 2013-08-22 ENCOUNTER — Encounter: Payer: Self-pay | Admitting: Family Medicine

## 2013-08-22 ENCOUNTER — Ambulatory Visit (INDEPENDENT_AMBULATORY_CARE_PROVIDER_SITE_OTHER): Payer: 59 | Admitting: Family Medicine

## 2013-08-22 VITALS — BP 120/84 | Temp 98.7°F | Ht 69.5 in | Wt 260.0 lb

## 2013-08-22 DIAGNOSIS — E669 Obesity, unspecified: Secondary | ICD-10-CM

## 2013-08-22 DIAGNOSIS — E785 Hyperlipidemia, unspecified: Secondary | ICD-10-CM

## 2013-08-22 DIAGNOSIS — I10 Essential (primary) hypertension: Secondary | ICD-10-CM

## 2013-08-22 DIAGNOSIS — G4730 Sleep apnea, unspecified: Secondary | ICD-10-CM

## 2013-08-22 DIAGNOSIS — M25561 Pain in right knee: Secondary | ICD-10-CM

## 2013-08-22 DIAGNOSIS — M25569 Pain in unspecified knee: Secondary | ICD-10-CM

## 2013-08-22 DIAGNOSIS — I4949 Other premature depolarization: Secondary | ICD-10-CM

## 2013-08-22 DIAGNOSIS — N529 Male erectile dysfunction, unspecified: Secondary | ICD-10-CM

## 2013-08-22 DIAGNOSIS — M25562 Pain in left knee: Secondary | ICD-10-CM

## 2013-08-22 MED ORDER — SILDENAFIL CITRATE 100 MG PO TABS
50.0000 mg | ORAL_TABLET | Freq: Every day | ORAL | Status: DC | PRN
Start: 2013-08-22 — End: 2016-04-21

## 2013-08-22 MED ORDER — FUROSEMIDE 20 MG PO TABS: 20.0000 mg | ORAL_TABLET | Freq: Every day | ORAL | Status: DC

## 2013-08-22 MED ORDER — SILDENAFIL CITRATE 100 MG PO TABS: 50.0000 mg | ORAL_TABLET | Freq: Every day | ORAL | Status: DC | PRN

## 2013-08-22 MED ORDER — SIMVASTATIN 20 MG PO TABS: 20.0000 mg | ORAL_TABLET | Freq: Every day | ORAL | Status: DC

## 2013-08-22 NOTE — Progress Notes (Signed)
Pre visit review using our clinic review tool, if applicable. No additional management support is needed unless otherwise documented below in the visit note. 

## 2013-08-22 NOTE — Patient Instructions (Signed)
Begin 8 exercise and diet program. The exercise should avoid any stress on your knees.,,,,,,,,,, swimming,,,,,,,, recumbent bike  Motrin 400 mg twice daily with food  Elevation and ice as needed in the evening if you knees are sore  Continue other medications  Followup in 2 months  Viagra 100 mg,,,,,,,, when necessary

## 2013-08-22 NOTE — Progress Notes (Signed)
   Subjective:    Patient ID: Travis Martinez, male    DOB: February 23, 1961, 53 y.o.   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 No history of trauma. Weight is 260 pounds. He says he's been trying aerobics  He's used Viagra in the past but the 100 mg pill works best  He gets routine eye care, dental care, colonoscopy a year ago showed some polyps. He's due for followup every 5 years.   Review of Systems  Constitutional: Negative.   HENT: Negative.   Eyes: Negative.   Respiratory: Negative.   Cardiovascular: Negative.   Gastrointestinal: Negative.   Genitourinary: Negative.   Musculoskeletal: Negative.   Skin: Negative.   Neurological: Negative.   Psychiatric/Behavioral: Negative.        Objective:   Physical Exam  Nursing note and vitals reviewed. Constitutional: He is oriented to person, place, and time. He appears well-developed and well-nourished.  HENT:  Head: Normocephalic and atraumatic.  Right Ear: External ear normal.  Left Ear: External ear normal.  Nose: Nose normal.  Mouth/Throat: Oropharynx is clear and moist.  Eyes: Conjunctivae and EOM are normal. Pupils are equal, round, and reactive to light.  Neck: Normal range of motion. Neck supple. No JVD present. No tracheal deviation present. No thyromegaly present.  Cardiovascular: Normal rate, regular rhythm, normal heart sounds and intact distal pulses.  Exam reveals no gallop and no friction rub.   No murmur heard. Pulmonary/Chest: Effort normal and breath sounds normal. No stridor. No respiratory distress. He has no wheezes. He has no rales. He exhibits no tenderness.  Abdominal: Soft.  Bowel sounds are normal. He exhibits no distension and no mass. There is no tenderness. There is no rebound and no guarding.  Genitourinary: Rectum normal, prostate normal and penis normal. Guaiac negative stool. No penile tenderness.  Musculoskeletal: Normal range of motion. He exhibits no edema and no tenderness.  Examination of both knees show some slight effusion. No warmth. Ligaments are tight as a tech. There is tenderness bilateral medial joint line. Full range of motion  Lymphadenopathy:    He has no cervical adenopathy.  Neurological: He is alert and oriented to person, place, and time. He has normal reflexes. No cranial nerve deficit. He exhibits normal muscle tone.  Skin: Skin is warm and dry. No rash noted. No erythema. No pallor.  Psychiatric: He has a normal mood and affect. His behavior is normal. Judgment and thought content normal.          Assessment & Plan:  Healthy male  Hyperlipidemia continue simvastatin and aspirin  Fluid retention continue Lasix  Overweight begin diet and exercise program  Motrin 400 twice a day

## 2013-08-31 ENCOUNTER — Telehealth: Payer: Self-pay | Admitting: Family Medicine

## 2013-08-31 NOTE — Telephone Encounter (Signed)
Relevant patient education assigned to patient using Emmi. ° °

## 2013-10-24 ENCOUNTER — Ambulatory Visit: Payer: 59 | Admitting: Family Medicine

## 2014-12-03 ENCOUNTER — Other Ambulatory Visit: Payer: Self-pay | Admitting: Family Medicine

## 2014-12-13 ENCOUNTER — Encounter: Payer: Self-pay | Admitting: Family Medicine

## 2014-12-13 ENCOUNTER — Ambulatory Visit (INDEPENDENT_AMBULATORY_CARE_PROVIDER_SITE_OTHER): Payer: 59 | Admitting: Family Medicine

## 2014-12-13 VITALS — BP 120/84 | HR 82 | Temp 98.4°F | Ht 69.5 in | Wt 262.2 lb

## 2014-12-13 DIAGNOSIS — Z125 Encounter for screening for malignant neoplasm of prostate: Secondary | ICD-10-CM

## 2014-12-13 DIAGNOSIS — Z0000 Encounter for general adult medical examination without abnormal findings: Secondary | ICD-10-CM

## 2014-12-13 DIAGNOSIS — Z7189 Other specified counseling: Secondary | ICD-10-CM

## 2014-12-13 DIAGNOSIS — E785 Hyperlipidemia, unspecified: Secondary | ICD-10-CM | POA: Diagnosis not present

## 2014-12-13 DIAGNOSIS — Z7689 Persons encountering health services in other specified circumstances: Secondary | ICD-10-CM

## 2014-12-13 DIAGNOSIS — E669 Obesity, unspecified: Secondary | ICD-10-CM

## 2014-12-13 DIAGNOSIS — I10 Essential (primary) hypertension: Secondary | ICD-10-CM | POA: Diagnosis not present

## 2014-12-13 DIAGNOSIS — G4730 Sleep apnea, unspecified: Secondary | ICD-10-CM | POA: Diagnosis not present

## 2014-12-13 LAB — CBC WITH DIFFERENTIAL/PLATELET
BASOS ABS: 0 10*3/uL (ref 0.0–0.1)
Basophils Relative: 0.6 % (ref 0.0–3.0)
EOS ABS: 0.5 10*3/uL (ref 0.0–0.7)
Eosinophils Relative: 7.4 % — ABNORMAL HIGH (ref 0.0–5.0)
HCT: 47.4 % (ref 39.0–52.0)
Hemoglobin: 16.6 g/dL (ref 13.0–17.0)
LYMPHS PCT: 28.8 % (ref 12.0–46.0)
Lymphs Abs: 1.9 10*3/uL (ref 0.7–4.0)
MCHC: 35 g/dL (ref 30.0–36.0)
MCV: 86.4 fl (ref 78.0–100.0)
Monocytes Absolute: 0.5 10*3/uL (ref 0.1–1.0)
Monocytes Relative: 7.4 % (ref 3.0–12.0)
NEUTROS PCT: 55.8 % (ref 43.0–77.0)
Neutro Abs: 3.7 10*3/uL (ref 1.4–7.7)
PLATELETS: 176 10*3/uL (ref 150.0–400.0)
RBC: 5.49 Mil/uL (ref 4.22–5.81)
RDW: 13.7 % (ref 11.5–15.5)
WBC: 6.6 10*3/uL (ref 4.0–10.5)

## 2014-12-13 LAB — BASIC METABOLIC PANEL
BUN: 17 mg/dL (ref 6–23)
CO2: 28 mEq/L (ref 19–32)
Calcium: 10 mg/dL (ref 8.4–10.5)
Chloride: 103 mEq/L (ref 96–112)
Creatinine, Ser: 1.05 mg/dL (ref 0.40–1.50)
GFR: 78.45 mL/min (ref >60.00)
GLUCOSE: 89 mg/dL (ref 70–99)
Potassium: 3.9 mEq/L (ref 3.5–5.1)
Sodium: 139 mEq/L (ref 135–145)

## 2014-12-13 LAB — LIPID PANEL
Cholesterol: 210 mg/dL — ABNORMAL HIGH (ref 0–200)
HDL: 44 mg/dL (ref >39.00)
NONHDL: 166
Total CHOL/HDL Ratio: 5
Triglycerides: 215 mg/dL — ABNORMAL HIGH (ref 0.0–149.0)
VLDL: 43 mg/dL — ABNORMAL HIGH (ref 0.0–40.0)

## 2014-12-13 LAB — LDL CHOLESTEROL, DIRECT: Direct LDL: 135 mg/dL

## 2014-12-13 LAB — PSA: PSA: 1.25 ng/mL (ref 0.10–4.00)

## 2014-12-13 LAB — HEMOGLOBIN A1C: Hgb A1c MFr Bld: 5.6 % (ref 4.6–6.5)

## 2014-12-13 NOTE — Progress Notes (Signed)
Pre visit review using our clinic review tool, if applicable. No additional management support is needed unless otherwise documented below in the visit note. 

## 2014-12-13 NOTE — Patient Instructions (Signed)
BEFORE YOU LEAVE: -labs -follow up in 1 year for CPE and as needed  -We placed a referral for you as discussed to the pulmonologist. It usually takes about 1-2 weeks to process and schedule this referral. If you have not heard from [place] regarding this appointment in 2 weeks please contact our office.  We recommend the following healthy lifestyle measures: - eat a healthy diet consisting of lots of vegetables, fruits, beans, nuts, seeds, healthy meats such as white chicken and fish and whole grains.  - avoid fried foods, fast food, processed foods, sodas, red meet and other fattening foods.  - get a least 150 minutes of aerobic exercise per week.   In November you will be due for your colonoscopy

## 2014-12-13 NOTE — Progress Notes (Signed)
HPI:  Travis Martinez is here to establish care. Reports he is transferring. He does want to do his preventive care labs including his PSA and understands risks. He can't remember how long he was supposed to go on his colonoscopy - his last colonoscopy showed polyps. On review of chart had colonoscopy in 05/2012 with recs to repeat in 3 years.  Has the following chronic problems that require follow up and concerns today:  HLD: -simvastatin daily -denies leg cramps, cog changes  HTN/LE edema: -reports chronic -on lasix for 5 years, asa -Denies: CP, SOB, DOE, worsening swelling -reports saw cardiologist in the past  ED: -uses viagra rarely prescribed by his prior PCP -denies worsening, side effects to medication  ROS negative for unless reported above: fevers, unintentional weight loss, hearing or vision loss, chest pain, palpitations, struggling to breath, hemoptysis, melena, hematochezia, hematuria, falls, loc, si, thoughts of self harm  Past Medical History  Diagnosis Date  . PVC (premature ventricular contraction)   . Hypertension   . Obesity   . Glucose intolerance (malabsorption)   . Sleep apnea     reports used CPAP in the past, was loosing weight  . Umbilical hernia     ventral hernia, following GB surgery  . Abnormal glucose   . ED (erectile dysfunction) of organic origin   . Hyperlipidemia   . Arthritis     Past Surgical History  Procedure Laterality Date  . Appendectomy    . Tonsilectomy, adenoidectomy, bilateral myringotomy and tubes      Family History  Problem Relation Age of Onset  . Bipolar disorder Mother   . Arthritis Mother   . Colon polyps Mother   . Intracerebral hemorrhage    . Colon polyps Father   . Colon cancer Neg Hx   . Esophageal cancer Neg Hx   . Rectal cancer Neg Hx   . Stomach cancer Neg Hx     History   Social History  . Marital Status: Married    Spouse Name: N/A  . Number of Children: N/A  . Years of Education: N/A    Social History Main Topics  . Smoking status: Never Smoker   . Smokeless tobacco: Never Used  . Alcohol Use: 1.2 - 3.6 oz/week    2-6 Cans of beer per week  . Drug Use: No  . Sexual Activity: Not on file   Other Topics Concern  . None   Social History Narrative     Current outpatient prescriptions:  .  aspirin 81 MG tablet, Take 81 mg by mouth daily.  , Disp: , Rfl:  .  furosemide (LASIX) 20 MG tablet, TAKE 1 TABLET BY MOUTH DAILY., Disp: 100 tablet, Rfl: 0 .  Ibuprofen (ADVIL) 200 MG CAPS, Take by mouth. Take 600 mg -800 mg daily, Disp: , Rfl:  .  Multiple Vitamin (MULTIVITAMIN) tablet, Take 1 tablet by mouth daily., Disp: , Rfl:  .  sildenafil (VIAGRA) 100 MG tablet, Take 0.5-1 tablets (50-100 mg total) by mouth daily as needed for erectile dysfunction., Disp: 10 tablet, Rfl: 11 .  simvastatin (ZOCOR) 20 MG tablet, TAKE 1 TABLET BY MOUTH AT BEDTIME**NEED OFFICE VISIT FOR MORE REFILLS**, Disp: 100 tablet, Rfl: 0  EXAM:  Filed Vitals:   12/13/14 1434  BP: 120/84  Pulse: 82  Temp: 98.4 F (36.9 C)    Body mass index is 38.18 kg/(m^2).  GENERAL: vitals reviewed and listed above, alert, oriented, appears well hydrated and in no acute distress  HEENT: atraumatic, conjunttiva clear, no obvious abnormalities on inspection of external nose and ears  NECK: no obvious masses on inspection  LUNGS: clear to auscultation bilaterally, no wheezes, rales or rhonchi, good air movement  CV: HRRR, no peripheral edema  GU/RECTAL: deferred  ABD: BS +, soft, NTTP  MS: moves all extremities without noticeable abnormality  PSYCH: pleasant and cooperative, no obvious depression or anxiety  ASSESSMENT AND PLAN:  Discussed the following assessment and plan:  Visit for preventive health examination - Plan: HIV antibody (with reflex), Hep C Antibody, CBC with Differential  Sleep apnea referred to pulm  Essential hypertension - Plan: Basic metabolic panel  Obesity - Plan:  Hemoglobin A1c  Hyperlipemia - Plan: Lipid Panel  Prostate cancer screening - Plan: PSA, discussed risks , no symptoms  Encounter to establish care -We reviewed the PMH, PSH, FH, SH, Meds and Allergies. -We provided refills for any medications we will prescribe as needed. -We addressed current concerns per orders and patient instructions. -We have asked for records for pertinent exams, studies, vaccines and notes from previous providers. -We have advised patient to follow up per instructions below.   -Patient advised to return or notify a doctor immediately if symptoms worsen or persist or new concerns arise.  Patient Instructions  BEFORE YOU LEAVE: -labs -follow up in 1 year for CPE and as needed  -We placed a referral for you as discussed to the pulmonologist. It usually takes about 1-2 weeks to process and schedule this referral. If you have not heard from [place] regarding this appointment in 2 weeks please contact our office.  We recommend the following healthy lifestyle measures: - eat a healthy diet consisting of lots of vegetables, fruits, beans, nuts, seeds, healthy meats such as white chicken and fish and whole grains.  - avoid fried foods, fast food, processed foods, sodas, red meet and other fattening foods.  - get a least 150 minutes of aerobic exercise per week.   In November you will be due for your colonoscopy       Lucretia Kern.

## 2014-12-14 LAB — HEPATITIS C ANTIBODY: HCV AB: NEGATIVE

## 2014-12-14 LAB — HIV ANTIBODY (ROUTINE TESTING W REFLEX): HIV 1&2 Ab, 4th Generation: NONREACTIVE

## 2014-12-14 MED ORDER — SIMVASTATIN 20 MG PO TABS: ORAL_TABLET | ORAL | Status: DC

## 2014-12-14 NOTE — Addendum Note (Signed)
Addended by: Agnes Lawrence on: 12/14/2014 09:38 AM   Modules accepted: Orders

## 2015-02-06 DIAGNOSIS — G4730 Sleep apnea, unspecified: Secondary | ICD-10-CM | POA: Diagnosis not present

## 2015-02-08 ENCOUNTER — Encounter: Payer: Self-pay | Admitting: Pulmonary Disease

## 2015-02-08 ENCOUNTER — Ambulatory Visit (INDEPENDENT_AMBULATORY_CARE_PROVIDER_SITE_OTHER): Payer: 59 | Admitting: Pulmonary Disease

## 2015-02-08 VITALS — BP 148/98 | HR 80 | Ht 68.0 in | Wt 265.4 lb

## 2015-02-08 DIAGNOSIS — G4733 Obstructive sleep apnea (adult) (pediatric): Secondary | ICD-10-CM

## 2015-02-08 NOTE — Patient Instructions (Signed)
Will arrange for home sleep study Will call to arrange for follow up after sleep study reviewed  

## 2015-02-08 NOTE — Progress Notes (Deleted)
   Subjective:    Patient ID: [name], male    DOB: 04/15/1961, 54 y.o.   MRN: [id number]  HPI    Review of Systems  Constitutional: Negative for fever and unexpected weight change.  HENT: Positive for congestion, postnasal drip and rhinorrhea. Negative for dental problem, ear pain, nosebleeds, sinus pressure, sneezing, sore throat and trouble swallowing.   Eyes: Negative for redness and itching.  Respiratory: Negative for cough, chest tightness, shortness of breath and wheezing.   Cardiovascular: Negative for palpitations and leg swelling.  Gastrointestinal: Negative for nausea and vomiting.  Genitourinary: Negative for dysuria.  Musculoskeletal: Negative for joint swelling.  Skin: Negative for rash.  Neurological: Negative for headaches.  Hematological: Does not bruise/bleed easily.  Psychiatric/Behavioral: Negative for dysphoric mood. The patient is not nervous/anxious.        Objective:   Physical Exam        Assessment & Plan:

## 2015-02-08 NOTE — Progress Notes (Signed)
Chief Complaint  Patient presents with  . Sleep Consult    Pt referred by Dr. Maudie Mercury for OSA. Pt states he is not on CPAP d/t nonfunctional CPAP. EPWORTH- 11    History of Present Illness: Travis Martinez is a 54 y.o. male for evaluation of sleep problems.  He has hx of sleep apnea.  He was on CPAP.  His machine broke several yrs ago.  He lost weight and his sleep got better.  Unfortunately, he regained the weight, and his sleep has gotten worse.  He is snoring, and his girlfriend says he stops breathing.  He will wake up snoring, and has trouble sleeping on his back.  He has trouble staying focused, and can fall asleep when sitting still.  He goes to sleep at 9 pm.  He falls asleep quickly.  He sleeps through the night.  He gets out of bed at 7 am.  He feels tired in the morning.  He denies morning headache.  He does not use anything to help him fall sleep.  He drinks 20 ounces of coffee during the day.  He denies sleep walking, sleep talking, bruxism, or nightmares.  There is no history of restless legs.  He denies sleep hallucinations, sleep paralysis, or cataplexy.  The Epworth score is 11 out of 24.   Travis Martinez  has a past medical history of PVC (premature ventricular contraction); Hypertension; Obesity; Glucose intolerance (malabsorption); Sleep apnea; Umbilical hernia; Abnormal glucose; ED (erectile dysfunction) of organic origin; Hyperlipidemia; and Arthritis.  Travis Martinez  has past surgical history that includes Appendectomy and Tonsilectomy, adenoidectomy, bilateral myringotomy and tubes.  Prior to Admission medications   Medication Sig Start Date End Date Taking? Authorizing Provider  aspirin 81 MG tablet Take 81 mg by mouth daily.     Yes Historical Provider, MD  furosemide (LASIX) 20 MG tablet TAKE 1 TABLET BY MOUTH DAILY. 12/03/14  Yes Dorena Cookey, MD  Ibuprofen (ADVIL) 200 MG CAPS Take by mouth. Take 600 mg -800 mg daily   Yes Historical Provider, MD  Multiple Vitamin  (MULTIVITAMIN) tablet Take 1 tablet by mouth daily.   Yes Historical Provider, MD  sildenafil (VIAGRA) 100 MG tablet Take 0.5-1 tablets (50-100 mg total) by mouth daily as needed for erectile dysfunction. 08/22/13  Yes Dorena Cookey, MD  simvastatin (ZOCOR) 20 MG tablet Take 1.5 tablets by mouth at bedtime 12/14/14  Yes Lucretia Kern, DO    No Known Allergies  His family history includes Arthritis in his mother; Bipolar disorder in his mother; Colon polyps in his father and mother; Intracerebral hemorrhage in an other family member. There is no history of Colon cancer, Esophageal cancer, Rectal cancer, or Stomach cancer.  He  reports that he has never smoked. He has never used smokeless tobacco. He reports that he drinks about 1.2 - 3.6 oz of alcohol per week. He reports that he does not use illicit drugs.  Review of Systems  Constitutional: Negative for fever and unexpected weight change.  HENT: Positive for congestion, postnasal drip and rhinorrhea. Negative for dental problem, ear pain, nosebleeds, sinus pressure, sneezing, sore throat and trouble swallowing.   Eyes: Negative for redness and itching.  Respiratory: Negative for cough, chest tightness, shortness of breath and wheezing.   Cardiovascular: Negative for palpitations and leg swelling.  Gastrointestinal: Negative for nausea and vomiting.  Genitourinary: Negative for dysuria.  Musculoskeletal: Negative for joint swelling.  Skin: Negative for rash.  Neurological: Negative for  headaches.  Hematological: Does not bruise/bleed easily.  Psychiatric/Behavioral: Negative for dysphoric mood. The patient is not nervous/anxious.    Physical Exam: BP 148/98 mmHg  Pulse 80  Ht 5\' 8"  (1.727 m)  Wt 265 lb 6.4 oz (120.385 kg)  BMI 40.36 kg/m2  SpO2 96%  General - No distress ENT - No sinus tenderness, no oral exudate, no LAN, no thyromegaly, TM clear, pupils equal/reactive, MP 3 Cardiac - s1s2 regular, no murmur, pulses symmetric Chest  - No wheeze/rales/dullness, good air entry, normal respiratory excursion Back - No focal tenderness Abd - Soft, non-tender, no organomegaly, + bowel sounds Ext - No edema Neuro - Normal strength, cranial nerves intact Skin - No rashes Psych - Normal mood, and behavior  Discussion: He has snoring, sleep disruption, witnessed apnea, and daytime sleepiness.  He has prior hx of sleep apnea.  His BMI is > 35.  I am concerned he still has sleep apnea.  We discussed how sleep apnea can affect various health problems including risks for hypertension, cardiovascular disease, and diabetes.  We also discussed how sleep disruption can increase risks for accident, such as while driving.  Weight loss as a means of improving sleep apnea was also reviewed.  Additional treatment options discussed were CPAP therapy, oral appliance, and surgical intervention.   Assessment/plan:  Obstructive sleep apnea. Plan: - will arrange for home sleep study, pending insurance approval  Obesity. Plan: - discussed importance of weight loss  Elevated blood pressure. Plan: - he is to f/u with his PCP   Chesley Mires, M.D. Pager [phone]

## 2015-02-11 ENCOUNTER — Telehealth: Payer: Self-pay | Admitting: Pulmonary Disease

## 2015-02-11 NOTE — Telephone Encounter (Signed)
Will read sleep studies by Wednesday.  Will let him know after study reviewed.

## 2015-02-11 NOTE — Telephone Encounter (Signed)
Dr Halford Chessman- Do you have a HST on this patient? Pt is concerned that the results are not accurate d/t possible machine malfunction during the night.

## 2015-02-11 NOTE — Telephone Encounter (Signed)
Patient has concerns about the sleep study, he says that when he laid on his back, the machine would flash yellow lights and he is not sure if he received an accurate test. Patient would like to know results of sleep study. I did not see results in chart.  Ashtyn - do you have these results?  Please advise.

## 2015-02-11 NOTE — Telephone Encounter (Signed)
Patient aware that as soon as Sleep Study is read, we will call with results.

## 2015-02-13 DIAGNOSIS — G4730 Sleep apnea, unspecified: Secondary | ICD-10-CM | POA: Diagnosis not present

## 2015-02-14 ENCOUNTER — Telehealth: Payer: Self-pay | Admitting: Pulmonary Disease

## 2015-02-14 DIAGNOSIS — G4733 Obstructive sleep apnea (adult) (pediatric): Secondary | ICD-10-CM

## 2015-02-14 NOTE — Telephone Encounter (Signed)
HST 02/10/15 >> AHI 56.9, SaO2 low 69%.  Will have my nurse inform pt that sleep study shows severe sleep apnea.  Options are 1) CPAP now, 2) ROV first.  If He is agreeable to CPAP, then please send order for auto CPAP range 5 to 15 cm H2O with heated humidity and mask of choice.  Have download sent 1 month after starting CPAP and set up ROV 2 months after starting CPAP.

## 2015-02-18 ENCOUNTER — Other Ambulatory Visit: Payer: Self-pay | Admitting: *Deleted

## 2015-02-18 DIAGNOSIS — G4733 Obstructive sleep apnea (adult) (pediatric): Secondary | ICD-10-CM

## 2015-02-18 NOTE — Telephone Encounter (Signed)
Virl Cagey, CMA at 02/18/2015 12:54 PM     Status: Signed       Expand All Collapse All   Results have been explained to patient, pt expressed understanding. Order placed for CPAP. 2 mo recall entered. Nothing further needed.             Chesley Mires, MD at 02/14/2015 2:43 PM     Status: Signed       Expand All Collapse All   HST 02/10/15 >> AHI 56.9, SaO2 low 69%.  Will have my nurse inform pt that sleep study shows severe sleep apnea.  Options are 1) CPAP now, 2) ROV first.  If He is agreeable to CPAP, then please send order for auto CPAP range 5 to 15 cm H2O with heated humidity and mask of choice. Have download sent 1 month after starting CPAP and set up ROV 2 months after starting CPAP.

## 2015-02-18 NOTE — Telephone Encounter (Signed)
Results have been explained to patient, pt expressed understanding. Order placed for CPAP. 2 mo recall entered. Nothing further needed.

## 2015-02-22 ENCOUNTER — Encounter: Payer: Self-pay | Admitting: Pulmonary Disease

## 2015-03-20 ENCOUNTER — Other Ambulatory Visit: Payer: Self-pay | Admitting: *Deleted

## 2015-03-20 MED ORDER — FUROSEMIDE 20 MG PO TABS: 20.0000 mg | ORAL_TABLET | Freq: Every day | ORAL | Status: DC

## 2015-04-17 ENCOUNTER — Ambulatory Visit (INDEPENDENT_AMBULATORY_CARE_PROVIDER_SITE_OTHER): Payer: 59 | Admitting: Family Medicine

## 2015-04-17 ENCOUNTER — Encounter: Payer: Self-pay | Admitting: Family Medicine

## 2015-04-17 VITALS — BP 118/80 | HR 74 | Temp 98.2°F | Ht 68.0 in | Wt 278.6 lb

## 2015-04-17 DIAGNOSIS — E669 Obesity, unspecified: Secondary | ICD-10-CM

## 2015-04-17 DIAGNOSIS — E785 Hyperlipidemia, unspecified: Secondary | ICD-10-CM | POA: Diagnosis not present

## 2015-04-17 DIAGNOSIS — Z23 Encounter for immunization: Secondary | ICD-10-CM

## 2015-04-17 DIAGNOSIS — I10 Essential (primary) hypertension: Secondary | ICD-10-CM

## 2015-04-17 LAB — LIPID PANEL
CHOL/HDL RATIO: 5
Cholesterol: 178 mg/dL (ref 0–200)
HDL: 38.4 mg/dL — AB (ref >39.00)
NONHDL: 139.44
Triglycerides: 225 mg/dL — ABNORMAL HIGH (ref 0.0–149.0)
VLDL: 45 mg/dL — AB (ref 0.0–40.0)

## 2015-04-17 LAB — LDL CHOLESTEROL, DIRECT: LDL DIRECT: 112 mg/dL

## 2015-04-17 NOTE — Patient Instructions (Signed)
BEFORE YOU LEAVE: -labs -follow up in 4-6 months  We recommend the following healthy lifestyle measures: - eat a healthy diet consisting of regular small portions of vegetables, fruits, beans, nuts, seeds, healthy meats such as white chicken and fish and whole grains.  - avoid sweets, sweet drinks, white starches, fried foods, fast food, processed foods, sodas, red meet and other fattening foods.  - get a least 150 minutes of aerobic exercise per week.

## 2015-04-17 NOTE — Progress Notes (Signed)
Pre visit review using our clinic review tool, if applicable. No additional management support is needed unless otherwise documented below in the visit note. 

## 2015-04-17 NOTE — Progress Notes (Signed)
HPI:  HLD/Obesity: -simvastatin 30mg  daily -denies leg cramps, cog changes  HTN/LE edema: -reports chronic -on lasix for 5 years, asa -Denies: CP, SOB, DOE, worsening swelling -reports saw cardiologist in the past  ED: -uses viagra rarely prescribed by his prior PCP -denies worsening, side effects to medication  OSA: -sees pulmonologist -feeling much better now that he is back on CPAP  ROS: See pertinent positives and negatives per HPI.  Past Medical History  Diagnosis Date  . PVC (premature ventricular contraction)   . Hypertension   . Obesity   . Glucose intolerance (malabsorption)   . Sleep apnea     reports used CPAP in the past, was loosing weight  . Umbilical hernia     ventral hernia, following GB surgery  . Abnormal glucose   . ED (erectile dysfunction) of organic origin   . Hyperlipidemia   . Arthritis     Past Surgical History  Procedure Laterality Date  . Appendectomy    . Tonsilectomy, adenoidectomy, bilateral myringotomy and tubes      Family History  Problem Relation Age of Onset  . Bipolar disorder Mother   . Arthritis Mother   . Colon polyps Mother   . Intracerebral hemorrhage    . Colon polyps Father   . Colon cancer Neg Hx   . Esophageal cancer Neg Hx   . Rectal cancer Neg Hx   . Stomach cancer Neg Hx     Social History   Social History  . Marital Status: Married    Spouse Name: N/A  . Number of Children: N/A  . Years of Education: N/A   Social History Main Topics  . Smoking status: Never Smoker   . Smokeless tobacco: Never Used  . Alcohol Use: 1.2 - 3.6 oz/week    2-6 Cans of beer per week  . Drug Use: No  . Sexual Activity: Not Asked   Other Topics Concern  . None   Social History Narrative     Current outpatient prescriptions:  .  aspirin 81 MG tablet, Take 81 mg by mouth daily.  , Disp: , Rfl:  .  furosemide (LASIX) 20 MG tablet, Take 1 tablet (20 mg total) by mouth daily., Disp: 90 tablet, Rfl: 0 .  Ibuprofen  (ADVIL) 200 MG CAPS, Take by mouth. Take 600 mg -800 mg daily, Disp: , Rfl:  .  Multiple Vitamin (MULTIVITAMIN) tablet, Take 1 tablet by mouth daily., Disp: , Rfl:  .  sildenafil (VIAGRA) 100 MG tablet, Take 0.5-1 tablets (50-100 mg total) by mouth daily as needed for erectile dysfunction., Disp: 10 tablet, Rfl: 11 .  simvastatin (ZOCOR) 20 MG tablet, Take 1.5 tablets by mouth at bedtime, Disp: 90 tablet, Rfl: 0  EXAM:  Filed Vitals:   04/17/15 0846  BP: 118/80  Pulse: 74  Temp: 98.2 F (36.8 C)    Body mass index is 42.37 kg/(m^2).  GENERAL: vitals reviewed and listed above, alert, oriented, appears well hydrated and in no acute distress  HEENT: atraumatic, conjunttiva clear, no obvious abnormalities on inspection of external nose and ears  NECK: no obvious masses on inspection  LUNGS: clear to auscultation bilaterally, no wheezes, rales or rhonchi, good air movement  CV: HRRR, no peripheral edema  MS: moves all extremities without noticeable abnormality  PSYCH: pleasant and cooperative, no obvious depression or anxiety  ASSESSMENT AND PLAN:  Discussed the following assessment and plan:  Obesity  Essential hypertension  Hyperlipemia - Plan: Lipid panel  -recheck lipids -lifestyle  recs discussed at length -Patient advised to return or notify a doctor immediately if symptoms worsen or persist or new concerns arise.  Patient Instructions  BEFORE YOU LEAVE: -labs -follow up in 4-6 months  We recommend the following healthy lifestyle measures: - eat a healthy diet consisting of regular small portions of vegetables, fruits, beans, nuts, seeds, healthy meats such as white chicken and fish and whole grains.  - avoid sweets, sweet drinks, white starches, fried foods, fast food, processed foods, sodas, red meet and other fattening foods.  - get a least 150 minutes of aerobic exercise per week.       Travis Martinez R.

## 2015-04-22 ENCOUNTER — Ambulatory Visit (INDEPENDENT_AMBULATORY_CARE_PROVIDER_SITE_OTHER): Payer: 59 | Admitting: Pulmonary Disease

## 2015-04-22 ENCOUNTER — Encounter: Payer: Self-pay | Admitting: Pulmonary Disease

## 2015-04-22 VITALS — BP 134/82 | HR 68 | Temp 98.7°F | Ht 68.0 in | Wt 279.0 lb

## 2015-04-22 DIAGNOSIS — Z6841 Body Mass Index (BMI) 40.0 and over, adult: Secondary | ICD-10-CM | POA: Diagnosis not present

## 2015-04-22 DIAGNOSIS — G4733 Obstructive sleep apnea (adult) (pediatric): Secondary | ICD-10-CM | POA: Diagnosis not present

## 2015-04-22 NOTE — Patient Instructions (Signed)
Follow up in 1 year.

## 2015-04-22 NOTE — Addendum Note (Signed)
Addended by: Chesley Mires on: 04/22/2015 02:35 PM   Modules accepted: Orders

## 2015-04-22 NOTE — Progress Notes (Signed)
Chief Complaint  Patient presents with  . Follow-up    pt states he is doing very well. pt using CPAP everynight, for about 6 - 8 hours a night. pressure and mask good for pt. pt needs a perscription for replacement nasal pillows . DME: [facility]     History of Present Illness: Travis Martinez is a 54 y.o. male with OSA.  Since his last visit he had sleep study which showed severe sleep apnea.  He was started on CPAP.  He has been sleeping much better.  He is more rested during the day.  He uses nasal pillows mask.  He is struggling with his weight.  TESTS: HST 02/10/15 >> AHI 56.9, SaO2 low 69%. Auto CPAP 02/22/15 to 03/23/15 >> used on 30 of 30 nights with average 8 hrs and 14 min.  Average AHI is 2.2 with median CPAP 10 cm H2O and 95 th percentile CPAP 13 cm H20.   PMhx >> HTN, HLD, ED  Past surgical hx, Medications, Allergies, Family hx, Social hx all reviewed.   Physical Exam: BP 134/82 mmHg  Pulse 68  Temp(Src) 98.7 F (37.1 C) (Oral)  Ht 5\' 8"  (1.727 m)  Wt 279 lb (126.554 kg)  BMI 42.43 kg/m2  SpO2 95%  General - No distress ENT - No sinus tenderness, no oral exudate, no LAN, MP3 Cardiac - s1s2 regular, no murmur Chest - No wheeze/rales/dullness Back - No focal tenderness Abd - Soft, non-tender Ext - No edema Neuro - Normal strength Skin - No rashes Psych - normal mood, and behavior   Assessment/Plan:  Obstructive sleep apnea. He is compliant with CPAP and reports benefit from therapy. Plan: - continue auto CPAP  Morbid obesity. Plan: - discussed options to assist with weight loss   Chesley Mires, MD [facility] Pager:  [phone]

## 2015-06-04 ENCOUNTER — Encounter: Payer: Self-pay | Admitting: Internal Medicine

## 2015-06-07 ENCOUNTER — Other Ambulatory Visit: Payer: Self-pay | Admitting: *Deleted

## 2015-06-07 ENCOUNTER — Encounter: Payer: Self-pay | Admitting: Family Medicine

## 2015-06-07 MED ORDER — SIMVASTATIN 20 MG PO TABS: ORAL_TABLET | ORAL | Status: DC

## 2015-06-07 NOTE — Telephone Encounter (Signed)
Rx done and the pt was informed via Mychart message. 

## 2015-07-18 ENCOUNTER — Other Ambulatory Visit: Payer: Self-pay | Admitting: Family Medicine

## 2015-07-18 MED ORDER — FUROSEMIDE 20 MG PO TABS: 20.0000 mg | ORAL_TABLET | Freq: Every day | ORAL | Status: DC

## 2015-07-18 NOTE — Telephone Encounter (Signed)
Rx done and the pt was informed via Mychart message. 

## 2015-09-03 ENCOUNTER — Encounter: Payer: Self-pay | Admitting: Pulmonary Disease

## 2015-09-03 DIAGNOSIS — G4733 Obstructive sleep apnea (adult) (pediatric): Secondary | ICD-10-CM

## 2015-09-13 DIAGNOSIS — G4733 Obstructive sleep apnea (adult) (pediatric): Secondary | ICD-10-CM | POA: Diagnosis not present

## 2015-09-16 ENCOUNTER — Ambulatory Visit (INDEPENDENT_AMBULATORY_CARE_PROVIDER_SITE_OTHER): Payer: 59 | Admitting: Family Medicine

## 2015-09-16 ENCOUNTER — Encounter: Payer: Self-pay | Admitting: Family Medicine

## 2015-09-16 VITALS — BP 102/62 | HR 77 | Temp 98.4°F | Ht 68.0 in | Wt 277.7 lb

## 2015-09-16 DIAGNOSIS — J988 Other specified respiratory disorders: Secondary | ICD-10-CM | POA: Diagnosis not present

## 2015-09-16 MED ORDER — DOXYCYCLINE HYCLATE 100 MG PO TABS: 100.0000 mg | ORAL_TABLET | Freq: Two times a day (BID) | ORAL | Status: DC

## 2015-09-16 MED FILL — SIMVASTATIN 20 MG TABLET: 20 | 90 days supply | Qty: 135 | Fill #1

## 2015-09-16 NOTE — Progress Notes (Signed)
Pre visit review using our clinic review tool, if applicable. No additional management support is needed unless otherwise documented below in the visit note. 

## 2015-09-16 NOTE — Progress Notes (Signed)
HPI:  Cough and congestion -started: 5 days ago, feels is getting worse -symptoms:nasal congestion, sore throat, cough, now with worsening cough and malaise and cough productive of thick mucus  -denies:fever, SOB, body aches, NVD, tooth pain -has tried: nyquil which helps the cough -sick contacts/travel/risks: denies flu exposure, tick exposure or or Ebola risks  ROS: See pertinent positives and negatives per HPI.  Past Medical History  Diagnosis Date  . PVC (premature ventricular contraction)   . Hypertension   . Obesity   . Glucose intolerance (malabsorption)   . Sleep apnea     reports used CPAP in the past, was loosing weight  . Umbilical hernia     ventral hernia, following GB surgery  . Abnormal glucose   . ED (erectile dysfunction) of organic origin   . Hyperlipidemia   . Arthritis     Past Surgical History  Procedure Laterality Date  . Appendectomy    . Tonsilectomy, adenoidectomy, bilateral myringotomy and tubes      Family History  Problem Relation Age of Onset  . Bipolar disorder Mother   . Arthritis Mother   . Colon polyps Mother   . Intracerebral hemorrhage    . Colon polyps Father   . Colon cancer Neg Hx   . Esophageal cancer Neg Hx   . Rectal cancer Neg Hx   . Stomach cancer Neg Hx     Social History   Social History  . Marital Status: Married    Spouse Name: N/A  . Number of Children: N/A  . Years of Education: N/A   Social History Main Topics  . Smoking status: Never Smoker   . Smokeless tobacco: Never Used  . Alcohol Use: 1.2 - 3.6 oz/week    2-6 Cans of beer per week  . Drug Use: No  . Sexual Activity: Not Asked   Other Topics Concern  . None   Social History Narrative     Current outpatient prescriptions:  .  aspirin 81 MG tablet, Take 81 mg by mouth daily.  , Disp: , Rfl:  .  furosemide (LASIX) 20 MG tablet, Take 1 tablet (20 mg total) by mouth daily., Disp: 90 tablet, Rfl: 0 .  Ibuprofen (ADVIL) 200 MG CAPS, Take by  mouth as needed. Take 600 mg -800 mg daily, Disp: , Rfl:  .  Multiple Vitamin (MULTIVITAMIN) tablet, Take 1 tablet by mouth daily., Disp: , Rfl:  .  sildenafil (VIAGRA) 100 MG tablet, Take 0.5-1 tablets (50-100 mg total) by mouth daily as needed for erectile dysfunction., Disp: 10 tablet, Rfl: 11 .  simvastatin (ZOCOR) 20 MG tablet, Take 1.5 tablets by mouth at bedtime, Disp: 135 tablet, Rfl: 1 .  doxycycline (VIBRA-TABS) 100 MG tablet, Take 1 tablet (100 mg total) by mouth 2 (two) times daily., Disp: 20 tablet, Rfl: 0  EXAM:  Filed Vitals:   09/16/15 1439  BP: 102/62  Pulse: 77  Temp: 98.4 F (36.9 C)    Body mass index is 42.23 kg/(m^2).  GENERAL: vitals reviewed and listed above, alert, oriented, appears well hydrated and in no acute distress  HEENT: atraumatic, conjunttiva clear, no obvious abnormalities on inspection of external nose and ears, normal appearance of ear canals and TMs, clear nasal congestion, mild post oropharyngeal erythema with PND, no tonsillar edema or exudate, no sinus TTP  NECK: no obvious masses on inspection  LUNGS: clear to auscultation bilaterally, no wheezes, rales or rhonchi, good air movement except for rhonchorous sounds right base  CV: HRRR, no peripheral edema  MS: moves all extremities without noticeable abnormality  PSYCH: pleasant and cooperative, no obvious depression or anxiety  ASSESSMENT AND PLAN:  Discussed the following assessment and plan:  Respiratory infection  -Likely Viral illness w/ possible mild secondary bacterial lung infection. Opted for treatment with abx. We discussed treatment side effects, likely course,  transmission, and signs of developing a worsening or serious illness. -advised staff to assist him with his out of date health maintenance -of course, we advised to return or notify a doctor immediately if symptoms worsen or persist or new concerns arise.    Patient Instructions  Take the antibiotic as  prescribed.  Please follow up with any worsening, new symptoms or you are not improving on treatment.     Colin Benton R.

## 2015-09-16 NOTE — Patient Instructions (Signed)
Take the antibiotic as prescribed.  Please follow up with any worsening, new symptoms or you are not improving on treatment.

## 2015-10-17 DIAGNOSIS — G4733 Obstructive sleep apnea (adult) (pediatric): Secondary | ICD-10-CM | POA: Diagnosis not present

## 2015-11-01 ENCOUNTER — Other Ambulatory Visit: Payer: Self-pay | Admitting: Family Medicine

## 2015-11-01 MED FILL — FUROSEMIDE 20 MG TABLET: 20 | 90 days supply | Qty: 90 | Fill #0 | Status: TO

## 2015-12-26 ENCOUNTER — Encounter: Payer: Self-pay | Admitting: Internal Medicine

## 2015-12-26 ENCOUNTER — Ambulatory Visit (INDEPENDENT_AMBULATORY_CARE_PROVIDER_SITE_OTHER): Payer: 59 | Admitting: Family Medicine

## 2015-12-26 ENCOUNTER — Encounter: Payer: Self-pay | Admitting: Family Medicine

## 2015-12-26 ENCOUNTER — Ambulatory Visit (INDEPENDENT_AMBULATORY_CARE_PROVIDER_SITE_OTHER)
Admission: RE | Admit: 2015-12-26 | Discharge: 2015-12-26 | Disposition: A | Discharge status: Home / Self Care | Payer: 59 | Source: Ambulatory Visit | Attending: Family Medicine | Admitting: Family Medicine

## 2015-12-26 VITALS — BP 118/80 | HR 71 | Temp 98.1°F | Ht 68.25 in | Wt 266.3 lb

## 2015-12-26 DIAGNOSIS — E669 Obesity, unspecified: Secondary | ICD-10-CM | POA: Diagnosis not present

## 2015-12-26 DIAGNOSIS — I10 Essential (primary) hypertension: Secondary | ICD-10-CM

## 2015-12-26 DIAGNOSIS — E785 Hyperlipidemia, unspecified: Secondary | ICD-10-CM

## 2015-12-26 DIAGNOSIS — K1370 Unspecified lesions of oral mucosa: Secondary | ICD-10-CM

## 2015-12-26 DIAGNOSIS — M25561 Pain in right knee: Secondary | ICD-10-CM

## 2015-12-26 DIAGNOSIS — G4733 Obstructive sleep apnea (adult) (pediatric): Secondary | ICD-10-CM

## 2015-12-26 DIAGNOSIS — N529 Male erectile dysfunction, unspecified: Secondary | ICD-10-CM

## 2015-12-26 DIAGNOSIS — M179 Osteoarthritis of knee, unspecified: Secondary | ICD-10-CM | POA: Diagnosis not present

## 2015-12-26 DIAGNOSIS — Z0000 Encounter for general adult medical examination without abnormal findings: Secondary | ICD-10-CM

## 2015-12-26 LAB — CBC
HEMATOCRIT: 45.3 % (ref 39.0–52.0)
HEMOGLOBIN: 15.6 g/dL (ref 13.0–17.0)
MCHC: 34.3 g/dL (ref 30.0–36.0)
MCV: 87.7 fl (ref 78.0–100.0)
PLATELETS: 168 10*3/uL (ref 150.0–400.0)
RBC: 5.17 Mil/uL (ref 4.22–5.81)
RDW: 13.7 % (ref 11.5–15.5)
WBC: 4.8 10*3/uL (ref 4.0–10.5)

## 2015-12-26 LAB — HEMOGLOBIN A1C: Hgb A1c MFr Bld: 5.5 % (ref 4.6–6.5)

## 2015-12-26 LAB — BASIC METABOLIC PANEL
BUN: 24 mg/dL — AB (ref 6–23)
CALCIUM: 10 mg/dL (ref 8.4–10.5)
CO2: 28 mEq/L (ref 19–32)
CREATININE: 1.08 mg/dL (ref 0.40–1.50)
Chloride: 102 mEq/L (ref 96–112)
GFR: 75.65 mL/min (ref >60.00)
Glucose, Bld: 88 mg/dL (ref 70–99)
POTASSIUM: 3.9 meq/L (ref 3.5–5.1)
Sodium: 140 mEq/L (ref 135–145)

## 2015-12-26 LAB — LDL CHOLESTEROL, DIRECT: LDL DIRECT: 87 mg/dL

## 2015-12-26 LAB — LIPID PANEL
CHOL/HDL RATIO: 5
Cholesterol: 166 mg/dL (ref 0–200)
HDL: 33.3 mg/dL — AB (ref >39.00)
NONHDL: 132.62
TRIGLYCERIDES: 270 mg/dL — AB (ref 0.0–149.0)
VLDL: 54 mg/dL — ABNORMAL HIGH (ref 0.0–40.0)

## 2015-12-26 NOTE — Progress Notes (Signed)
HPI:  Here for CPE:  -Concerns and/or follow up today:   R knee pain: -for many years, intermittent -worse with stairs; advil helps -denies: weakness, giving away, catching or locking  HLD/Obesity: -simvastatin 30mg  daily -denies leg cramps, cog changes  HTN/LE edema: -reports chronic -on lasix for 5 years, asa -Denies: CP, SOB, DOE, worsening swelling -reports saw cardiologist in the past  ED: -uses viagra rarely prescribed by his prior PCP -denies worsening, side effects to medication  OSA: -sees pulmonologist - on CPAP and is doing well  Toenail fungus: -cleared with oral tx in the past but he is concerned with liver risks  -Diet: working on diet and is happy that he has lost weight  -Exercise: no regular exercise  -Diabetes and Dyslipidemia Screening: FASTING  -Hx of HTN: no  -Vaccines: UTD  -wants STI testing, Hep C screening (if born 64-1964): no  -FH colon or prstate ca: see FH Last colon cancer screening: he plans to schedule in august with with [place], reports he did call to schedule and can only do in August, hx of polyps and a little late, no bleeding or changes in bowels Last prostate ca screening: done in 2016, doing every few years  -Alcohol, Tobacco, drug use: see social history - occ 1-2 drinks alcohol  Review of Systems - no fevers, unintentional weight loss, vision loss, hearing loss, chest pain, sob, hemoptysis, melena, hematochezia, hematuria, genital discharge, changing or concerning skin lesions, bleeding, bruising, loc, thoughts of self harm or SI  Past Medical History  Diagnosis Date  . PVC (premature ventricular contraction)   . Hypertension   . Obesity   . Glucose intolerance (malabsorption)   . Sleep apnea     reports used CPAP in the past, was loosing weight  . Umbilical hernia     ventral hernia, following GB surgery  . Abnormal glucose   . ED (erectile dysfunction) of organic origin   . Hyperlipidemia   . Arthritis      Past Surgical History  Procedure Laterality Date  . Appendectomy    . Tonsilectomy, adenoidectomy, bilateral myringotomy and tubes      Family History  Problem Relation Age of Onset  . Bipolar disorder Mother   . Arthritis Mother   . Colon polyps Mother   . Intracerebral hemorrhage    . Colon polyps Father   . Colon cancer Neg Hx   . Esophageal cancer Neg Hx   . Rectal cancer Neg Hx   . Stomach cancer Neg Hx     Social History   Social History  . Marital Status: Married    Spouse Name: N/A  . Number of Children: N/A  . Years of Education: N/A   Social History Main Topics  . Smoking status: Never Smoker   . Smokeless tobacco: Never Used  . Alcohol Use: 1.2 - 3.6 oz/week    2-6 Cans of beer per week  . Drug Use: No  . Sexual Activity: Not Asked   Other Topics Concern  . None   Social History Narrative     Current outpatient prescriptions:  .  aspirin 81 MG tablet, Take 81 mg by mouth daily.  , Disp: , Rfl:  .  furosemide (LASIX) 20 MG tablet, TAKE 1 TABLET BY MOUTH DAILY., Disp: 90 tablet, Rfl: 0 .  Ibuprofen (ADVIL) 200 MG CAPS, Take by mouth as needed. Take 600 mg -800 mg daily, Disp: , Rfl:  .  Multiple Vitamin (MULTIVITAMIN) tablet, Take 1 tablet  by mouth daily., Disp: , Rfl:  .  sildenafil (VIAGRA) 100 MG tablet, Take 0.5-1 tablets (50-100 mg total) by mouth daily as needed for erectile dysfunction., Disp: 10 tablet, Rfl: 11 .  simvastatin (ZOCOR) 20 MG tablet, Take 1.5 tablets by mouth at bedtime, Disp: 135 tablet, Rfl: 1  EXAM:  Filed Vitals:   12/26/15 0655  BP: 118/80  Pulse: 71  Temp: 98.1 F (36.7 C)  TempSrc: Oral  Height: 5' 8.25" (1.734 m)  Weight: 266 lb 4.8 oz (120.793 kg)    Estimated body mass index is 40.17 kg/(m^2) as calculated from the following:   Height as of this encounter: 5' 8.25" (1.734 m).   Weight as of this encounter: 266 lb 4.8 oz (120.793 kg).  GENERAL: vitals reviewed and listed below, alert, oriented, appears  well hydrated and in no acute distress  HEENT: head atraumatic, PERRLA, normal appearance of eyes, ears, nose and mouth. moist mucus membranes. peduculated lesion roof of mouth on L.  NECK: supple, no masses or lymphadenopathy  LUNGS: clear to auscultation bilaterally, no rales, rhonchi or wheeze  CV: HRRR, no peripheral edema or cyanosis, normal pedal pulses  ABDOMEN: obese, bowel sounds normal, soft, non tender to palpation, no masses, no rebound or guarding  GU: normal appearance of external genitalia - no lesions or masses, hernia exam normal.  SKIN: no rash or abnormal lesions  MS: normal gait, moves all extremities normally, normal inspection of both knees, scar R knee, normal ROM both knees, TTP along lat patella R, +patella crepitus bilat, neg ant/post drawer, neg lachman, Pain lateral jt line on mcMurray, NV intact distally  NEURO: CN II-XII grossly intact, normal muscle strength and sensation to light touch on extremities  PSYCH: normal affect, pleasant and cooperative  ASSESSMENT AND PLAN:  Discussed the following assessment and plan:  Visit for preventive health examination - Plan: Hemoglobin A1c -Discussed and advised all [place] preventive services health task force level A and B recommendations for age, sex and risks. -Advised at least 150 minutes of exercise per week and a healthy diet low in saturated fats and sweets and consisting of fresh fruits and vegetables, lean meats such as fish and white chicken and whole grains. -labs, studies and vaccines per orders this encounter  Right knee pain -suspect PFS, likely Oa as well, possible  Meniscal injury - but will start with conservative tx, sports med or ortho referral if persists -gentle exercise, tylenol prn in safe dosing -HEP -plain films -follow up prn if worsening or not improving with tx  Obesity -lifestyle recs  Hyperlipemia - Plan: Lipid Panel  Essential hypertension - Plan: Basic metabolic panel, CBC (no  diff) -cont current tx  Erectile dysfunction, unspecified erectile dysfunction type -cont current tx  Obstructive sleep apnea -cont current tx  Skin lesion: -advised biopsy to ensure not cancer/precancer -he reported dentist had offered to remove for biopsy and pr agreed to set this up  Onychomycosis: -discussed tx options and risks and he opted to observe for now  Patient advised to return to clinic immediately if symptoms worsen or persist or new concerns.  Patient Instructions  BEFORE YOU LEAVE: -follow up in 4 months -Patello fem exercises -xray sheet -labs  Please schedule your colonoscopy  Call your dentist today to set up appoinmtnet for removal of the lesion in your mouth  We recommend the following healthy lifestyle measures: - eat a healthy whole foods diet consisting of regular small meals composed of vegetables, fruits, beans, nuts,  seeds, healthy meats such as white chicken and fish and whole grains.  - avoid sweets, white starchy foods, fried foods, fast food, processed foods, sodas, red meet and other fattening foods.  - get a least 150-300 minutes of aerobic exercise per week.   We have ordered labs or studies at this visit. It can take up to 1-2 weeks for results and processing. IF results require follow up or explanation, we will call you with instructions. Clinically stable results will be released to your [facility]. If you have not heard from [place] or cannot find your results in [facility] in 2 weeks please contact our office at [phone].  If you are not yet signed up for [facility], please consider signing up.           No Follow-up on file.   Travis Benton R.

## 2015-12-26 NOTE — Patient Instructions (Addendum)
BEFORE YOU LEAVE: -follow up in 4 months -Patello fem exercises -xray sheet -labs  Please schedule your colonoscopy  Call your dentist today to set up appoinmtnet for removal of the lesion in your mouth  We recommend the following healthy lifestyle measures: - eat a healthy whole foods diet consisting of regular small meals composed of vegetables, fruits, beans, nuts, seeds, healthy meats such as white chicken and fish and whole grains.  - avoid sweets, white starchy foods, fried foods, fast food, processed foods, sodas, red meet and other fattening foods.  - get a least 150-300 minutes of aerobic exercise per week.   We have ordered labs or studies at this visit. It can take up to 1-2 weeks for results and processing. IF results require follow up or explanation, we will call you with instructions. Clinically stable results will be released to your [facility]. If you have not heard from [place] or cannot find your results in [facility] in 2 weeks please contact our office at [phone].  If you are not yet signed up for [facility] please consider signing up.

## 2015-12-26 NOTE — Progress Notes (Signed)
Pre visit review using our clinic review tool, if applicable. No additional management support is needed unless otherwise documented below in the visit note. 

## 2016-01-01 ENCOUNTER — Other Ambulatory Visit: Payer: Self-pay | Admitting: *Deleted

## 2016-01-01 DIAGNOSIS — G4733 Obstructive sleep apnea (adult) (pediatric): Secondary | ICD-10-CM | POA: Diagnosis not present

## 2016-01-01 MED ORDER — SIMVASTATIN 20 MG PO TABS: ORAL_TABLET | ORAL | Status: DC

## 2016-01-01 MED FILL — SIMVASTATIN 20 MG TABLET: 20 | 90 days supply | Qty: 135 | Fill #0

## 2016-02-11 ENCOUNTER — Other Ambulatory Visit: Payer: Self-pay | Admitting: Family Medicine

## 2016-02-11 MED FILL — FUROSEMIDE 20 MG TABLET: 20 | 90 days supply | Qty: 90 | Fill #0

## 2016-02-12 DIAGNOSIS — G4733 Obstructive sleep apnea (adult) (pediatric): Secondary | ICD-10-CM | POA: Diagnosis not present

## 2016-03-09 ENCOUNTER — Ambulatory Visit (AMBULATORY_SURGERY_CENTER): Payer: Self-pay | Admitting: *Deleted

## 2016-03-09 VITALS — Ht 68.0 in | Wt 281.0 lb

## 2016-03-09 DIAGNOSIS — Z86010 Personal history of colonic polyps: Secondary | ICD-10-CM

## 2016-03-09 MED ORDER — NA SULFATE-K SULFATE-MG SULF 17.5-3.13-1.6 GM/177ML PO SOLN: 1.0000 | Freq: Once | ORAL | 0 refills | Status: AC

## 2016-03-09 NOTE — Progress Notes (Signed)
No egg or soy allergy known to patient  No issues with past sedation with any surgeries  or procedures, no intubation problems  No diet pills per patient No home 02 use per patient  No blood thinners per patient  Pt denies issues with constipation   

## 2016-03-10 MED FILL — SUPREP BOWEL PREP KIT: 17.5-3.13-1 | 1 days supply | Qty: 354 | Fill #0

## 2016-03-16 HISTORY — PX: OTHER SURGICAL HISTORY: SHX169

## 2016-03-17 DIAGNOSIS — G4733 Obstructive sleep apnea (adult) (pediatric): Secondary | ICD-10-CM | POA: Diagnosis not present

## 2016-03-23 ENCOUNTER — Encounter: Payer: Self-pay | Admitting: Internal Medicine

## 2016-03-23 ENCOUNTER — Ambulatory Visit (AMBULATORY_SURGERY_CENTER): Payer: 59 | Admitting: Internal Medicine

## 2016-03-23 VITALS — BP 137/89 | HR 57 | Temp 98.7°F | Resp 12 | Ht 68.0 in | Wt 281.0 lb

## 2016-03-23 DIAGNOSIS — D122 Benign neoplasm of ascending colon: Secondary | ICD-10-CM

## 2016-03-23 DIAGNOSIS — D124 Benign neoplasm of descending colon: Secondary | ICD-10-CM | POA: Diagnosis not present

## 2016-03-23 DIAGNOSIS — G4733 Obstructive sleep apnea (adult) (pediatric): Secondary | ICD-10-CM | POA: Diagnosis not present

## 2016-03-23 DIAGNOSIS — D123 Benign neoplasm of transverse colon: Secondary | ICD-10-CM

## 2016-03-23 DIAGNOSIS — I10 Essential (primary) hypertension: Secondary | ICD-10-CM | POA: Diagnosis not present

## 2016-03-23 DIAGNOSIS — Z860100 Personal history of colon polyps, unspecified: Secondary | ICD-10-CM

## 2016-03-23 MED ORDER — SODIUM CHLORIDE 0.9 % IV SOLN: 500.0000 mL | INTRAVENOUS | Status: DC

## 2016-03-23 NOTE — Patient Instructions (Signed)
YOU HAD AN ENDOSCOPIC PROCEDURE TODAY AT [facility]:   Refer to the procedure report that was given to you for any specific questions about what was found during the examination.  If the procedure report does not answer your questions, please call your gastroenterologist to clarify.  If you requested that your care partner not be given the details of your procedure findings, then the procedure report has been included in a sealed envelope for you to review at your convenience later.  YOU SHOULD EXPECT: Some feelings of bloating in the abdomen. Passage of more gas than usual.  Walking can help get rid of the air that was put into your GI tract during the procedure and reduce the bloating. If you had a lower endoscopy (such as a colonoscopy or flexible sigmoidoscopy) you may notice spotting of blood in your stool or on the toilet paper. If you underwent a bowel prep for your procedure, you may not have a normal bowel movement for a few days.  Please Note:  You might notice some irritation and congestion in your nose or some drainage.  This is from the oxygen used during your procedure.  There is no need for concern and it should clear up in a day or so.  SYMPTOMS TO REPORT IMMEDIATELY:   Following lower endoscopy (colonoscopy or flexible sigmoidoscopy):  Excessive amounts of blood in the stool  Significant tenderness or worsening of abdominal pains  Swelling of the abdomen that is new, acute  Fever of 100F or higher   For urgent or emergent issues, a gastroenterologist can be reached at any hour by calling [phone].   DIET:  We do recommend a small meal at first, but then you may proceed to your regular diet.  Drink plenty of fluids but you should avoid alcoholic beverages for 24 hours.  ACTIVITY:  You should plan to take it easy for the rest of today and you should NOT DRIVE or use heavy machinery until tomorrow (because of the sedation medicines used during the test).     FOLLOW UP: Our staff will call the number listed on your records the next business day following your procedure to check on you and address any questions or concerns that you may have regarding the information given to you following your procedure. If we do not reach you, we will leave a message.  However, if you are feeling well and you are not experiencing any problems, there is no need to return our call.  We will assume that you have returned to your regular daily activities without incident.  If any biopsies were taken you will be contacted by phone or by letter within the next 1-3 weeks.  Please call us at [phone] if you have not heard about the biopsies in 3 weeks.    SIGNATURES/CONFIDENTIALITY: You and/or your care partner have signed paperwork which will be entered into your electronic medical record.  These signatures attest to the fact that that the information above on your After Visit Summary has been reviewed and is understood.  Full responsibility of the confidentiality of this discharge information lies with you and/or your care-partner.  Polyps, Diverticulosis, high fiber diet, and hemorrhoids handouts provided. Continue current medications.

## 2016-03-23 NOTE — Progress Notes (Signed)
Report to PACU, RN, vss, BBS= Clear.  

## 2016-03-23 NOTE — Op Note (Signed)
[facility] Patient Name: Travis Martinez Procedure Date: 03/23/2016 9:58 AM MRN: [id number] Endoscopist: Jerene Bears , MD Age: 55 Referring MD:  Date of Birth: 20-Mar-1961 Gender: Male Account #: [id number] Procedure:                Colonoscopy Indications:              High risk colon cancer surveillance: Personal                            history of multiple (3 or more) adenomas, Last                            colonoscopy: November 2013 Medicines:                Monitored Anesthesia Care Procedure:                Pre-Anesthesia Assessment:                           - Prior to the procedure, a History and Physical                            was performed, and patient medications and                            allergies were reviewed. The patient's tolerance of                            previous anesthesia was also reviewed. The risks                            and benefits of the procedure and the sedation                            options and risks were discussed with the patient.                            All questions were answered, and informed consent                            was obtained. Prior Anticoagulants: The patient has                            taken no previous anticoagulant or antiplatelet                            agents. ASA Grade Assessment: III - A patient with                            severe systemic disease. After reviewing the risks                            and benefits, the patient was deemed in  satisfactory condition to undergo the procedure.                           After obtaining informed consent, the colonoscope                            was passed under direct vision. Throughout the                            procedure, the patient's blood pressure, pulse, and                            oxygen saturations were monitored continuously. The                            Model CF-[id number]) scope was  introduced                            through the anus and advanced to the the cecum,                            identified by appendiceal orifice and ileocecal                            valve. The colonoscopy was performed without                            difficulty. The patient tolerated the procedure                            well. The quality of the bowel preparation was                            good. The ileocecal valve, appendiceal orifice, and                            rectum were photographed. Scope In: [id number] Scope Out: 10:30:43 AM Scope Withdrawal Time: 0 hours 20 minutes 11 seconds  Total Procedure Duration: 0 hours 24 minutes 32 seconds  Findings:                 The perianal and digital rectal examinations were                            normal.                           Three sessile polyps were found in the ascending                            colon. The polyps were 2 to 5 mm in size. These                            polyps were removed with a cold snare (2) and cold  forceps (1). Resection and retrieval were complete.                           Four sessile polyps were found in the transverse                            colon. The polyps were 2 to 6 mm in size. These                            polyps were removed with a cold snare (2) and cold                            forceps (2). Resection and retrieval were complete.                           Two sessile polyps were found in the descending                            colon. The polyps were 4 to 5 mm in size. These                            polyps were removed with a cold snare. Resection                            and retrieval were complete.                           Multiple medium-mouthed diverticula were found in                            the sigmoid colon.                           Internal hemorrhoids were found during                            retroflexion. The hemorrhoids  were small. Complications:            No immediate complications. Estimated Blood Loss:     Estimated blood loss was minimal. Impression:               - Three 2 to 5 mm polyps in the ascending colon,                            removed with a cold snare and cold forceps.                            Resected and retrieved.                           - Four 2 to 6 mm polyps in the transverse colon,                            removed with a cold  snare and cold forceps.                            Resected and retrieved.                           - Two 4 to 5 mm polyps in the descending colon,                            removed with a cold snare. Resected and retrieved.                           - Diverticulosis in the sigmoid colon.                           - Internal hemorrhoids. Recommendation:           - Patient has a contact number available for                            emergencies. The signs and symptoms of potential                            delayed complications were discussed with the                            patient. Return to normal activities tomorrow.                            Written discharge instructions were provided to the                            patient.                           - Resume previous diet.                           - Continue present medications.                           - Await pathology results.                           - Repeat colonoscopy is recommended for                            surveillance of multiple adenomas. The colonoscopy                            date will be determined after pathology results                            from today's exam become available for review. Jerene Bears, MD 03/23/2016 10:38:41 AM This report has been signed electronically.

## 2016-03-23 NOTE — Progress Notes (Signed)
Called to room to assist during endoscopic procedure.  Patient ID and intended procedure confirmed with present staff. Received instructions for my participation in the procedure from the performing physician.  

## 2016-03-24 ENCOUNTER — Telehealth: Payer: Self-pay | Admitting: *Deleted

## 2016-03-24 NOTE — Telephone Encounter (Signed)
  Follow up Call-  Call back number 03/23/2016  Post procedure Call Back phone  # [phone]  Permission to leave phone message Yes  Some recent data might be hidden     No answer. Left message to call if questions or concerns.

## 2016-03-27 ENCOUNTER — Encounter: Payer: Self-pay | Admitting: Internal Medicine

## 2016-04-08 MED FILL — SIMVASTATIN 20 MG TABLET: 20 | 90 days supply | Qty: 135 | Fill #0

## 2016-04-21 ENCOUNTER — Ambulatory Visit (INDEPENDENT_AMBULATORY_CARE_PROVIDER_SITE_OTHER): Payer: 59 | Admitting: Pulmonary Disease

## 2016-04-21 ENCOUNTER — Encounter: Payer: Self-pay | Admitting: Pulmonary Disease

## 2016-04-21 VITALS — BP 122/88 | HR 65 | Ht 68.0 in | Wt 289.0 lb

## 2016-04-21 DIAGNOSIS — Z23 Encounter for immunization: Secondary | ICD-10-CM

## 2016-04-21 DIAGNOSIS — G4733 Obstructive sleep apnea (adult) (pediatric): Secondary | ICD-10-CM

## 2016-04-21 DIAGNOSIS — Z6841 Body Mass Index (BMI) 40.0 and over, adult: Secondary | ICD-10-CM

## 2016-04-21 NOTE — Progress Notes (Signed)
Current Outpatient Prescriptions on File Prior to Visit  Medication Sig  . aspirin 81 MG tablet Take 81 mg by mouth daily.    . furosemide (LASIX) 20 MG tablet TAKE 1 TABLET BY MOUTH DAILY.  . Multiple Vitamin (MULTIVITAMIN) tablet Take 1 tablet by mouth daily.  . simvastatin (ZOCOR) 20 MG tablet Take 1.5 tablets by mouth at bedtime   Current Facility-Administered Medications on File Prior to Visit  Medication  . 0.9 %  sodium chloride infusion    Chief Complaint  Patient presents with  . Follow-up    Wears CPAP nightly. Denies problems with pressure. Pt states that his strap comes loose on mask and mask will fall off some night. DME: [facility]    Sleep tests HST 02/10/15 >> AHI 56.9, SaO2 low 69%. Auto CPAP 02/22/15 to 03/23/15 >> used on 30 of 30 nights with average 8 hrs and 14 min.  Average AHI is 2.2 with median CPAP 10 cm H2O and 95 th percentile CPAP 13 cm H20.  Past medical history HTN, HLD, ED  Past surgical history, Family history, Social history, Allergies reviewed  Vital signs BP 122/88 (BP Location: Left Arm, Cuff Size: Normal)   Pulse 65   Ht 5\' 8"  (1.727 m)   Wt 289 lb (131.1 kg)   SpO2 97%   BMI 43.94 kg/m   History of Present Illness: Travis Martinez is a 55 y.o. male with OSA.  He is doing well with CPAP.  His only issue is related to strap getting lose after several months of use.  He is not able to get replacement soon enough through insurance, so he buys one on his own in between.  He tried Atkins diet and lost almost 30 lbs.  Unfortunately, he stopped diet and regained weight.  Physical Exam:  General - No distress ENT - No sinus tenderness, no oral exudate, no LAN, MP3 Cardiac - s1s2 regular, no murmur Chest - No wheeze/rales/dullness Back - No focal tenderness Abd - Soft, non-tender Ext - No edema Neuro - Normal strength Skin - No rashes Psych - normal mood, and behavior   Assessment/Plan:  Obstructive sleep apnea. - He is compliant with CPAP  and reports benefit from therapy. - continue auto CPAP  Morbid obesity. - discussed options to assist with weight loss   Patient Instructions  Follow up in 1 year   Chesley Mires, MD [facility] Pager:  [phone] 04/21/2016, 4:06 PM

## 2016-04-21 NOTE — Patient Instructions (Signed)
Follow up in 1 year.

## 2016-04-27 ENCOUNTER — Ambulatory Visit (INDEPENDENT_AMBULATORY_CARE_PROVIDER_SITE_OTHER): Payer: 59 | Admitting: Family Medicine

## 2016-04-27 ENCOUNTER — Encounter: Payer: Self-pay | Admitting: Family Medicine

## 2016-04-27 DIAGNOSIS — E785 Hyperlipidemia, unspecified: Secondary | ICD-10-CM | POA: Diagnosis not present

## 2016-04-27 DIAGNOSIS — Z23 Encounter for immunization: Secondary | ICD-10-CM | POA: Diagnosis not present

## 2016-04-27 DIAGNOSIS — K219 Gastro-esophageal reflux disease without esophagitis: Secondary | ICD-10-CM | POA: Diagnosis not present

## 2016-04-27 DIAGNOSIS — I10 Essential (primary) hypertension: Secondary | ICD-10-CM

## 2016-04-27 NOTE — Progress Notes (Signed)
Pre visit review using our clinic review tool, if applicable. No additional management support is needed unless otherwise documented below in the visit note. 

## 2016-04-27 NOTE — Patient Instructions (Signed)
BEFORE YOU LEAVE: -follow up: in 3 months; come fasting if possible -flu shot  Nexium once daily. Follow up if acid symptoms not improving.    The Mediterranean Diet is great for reducing cardiovascular risks, improving health and helping with weight loss. The new weight watchers program is also great! For help with the Mediterranean Diet this website is helpful: www.medinsteadofmeds.com  We recommend the following healthy lifestyle for LIFE: 1) Small portions.   Tip: eat off of a salad plate instead of a dinner plate.  Tip: It is ok to feel hungry after a meal - that likely means you ate an appropriate portion.  Tip: if you need more or a snack choose fruits, veggies and/or a handful of nuts or seeds.  2) Eat a healthy clean diet.  * Tip: Avoid (less then 1 serving per week): processed foods, sweets, sweetened drinks, white starches (rice, flour, bread, potatoes, pasta, etc), red meat, fast foods, butter  *Tip: CHOOSE instead   * 5-9 servings per day of fresh or frozen fruits and vegetables (but not corn, potatoes, bananas, canned or dried fruit)   *nuts and seeds, beans   *olives and olive oil   *small portions of lean meats such as fish and white chicken    *small portions of whole grains  3)Get at least 150 minutes of sweaty aerobic exercise per week.  4)Reduce stress - consider counseling, meditation and relaxation to balance other aspects of your life.

## 2016-04-27 NOTE — Progress Notes (Signed)
HPI:  Follow up:  HTN/LE edema: -chronic -on lasix with prior PCP and for > 5 years, asa -Denies: CP, SOB, DOE, worsening swelling -saw cardiologist in the past  ED: -uses viagra rarely, prescribed by his prior PCP -denies worsening, side effects to medication  OSA: -sees pulmonologist, Dr. Halford Chessman - on CPAP and is doing well  Obesity/Elevated triglycerides: -lifestyle recs advised -wt 266 6/17 -->  283 -he gets no regular exercise and admits diet and portions are a problem - girlfriend does the cooking  GERD: -intermittent over the years -flare the last few months -occurs after large meals and at night -reflux into throat, heartburn -denies dysphagia, wt loss, symptoms with activity  ROS: See pertinent positives and negatives per HPI.  Past Medical History:  Diagnosis Date  . Abnormal glucose   . Arthritis   . Cataract    surgically removed right eye   . ED (erectile dysfunction) of organic origin   . Glucose intolerance (malabsorption)   . Hyperlipidemia   . Hypertension   . Obesity   . PVC (premature ventricular contraction)   . Sleep apnea    reports used CPAP in the past, was loosing weight  . Umbilical hernia    ventral hernia, following GB surgery    Past Surgical History:  Procedure Laterality Date  . APPENDECTOMY    . mouth growth removal   03/16/2016  . TONSILLECTOMY      Family History  Problem Relation Age of Onset  . Bipolar disorder Mother   . Arthritis Mother   . Colon polyps Mother   . Colon polyps Father   . Intracerebral hemorrhage    . Colon cancer Neg Hx   . Esophageal cancer Neg Hx   . Rectal cancer Neg Hx   . Stomach cancer Neg Hx     Social History   Social History  . Marital status: Married    Spouse name: N/A  . Number of children: N/A  . Years of education: N/A   Social History Main Topics  . Smoking status: Never Smoker  . Smokeless tobacco: Never Used  . Alcohol use 1.2 - 3.6 oz/week    2 - 6 Cans of beer  per week  . Drug use: No  . Sexual activity: Not Asked   Other Topics Concern  . None   Social History Narrative  . None     Current Outpatient Prescriptions:  .  aspirin 81 MG tablet, Take 81 mg by mouth daily.  , Disp: , Rfl:  .  furosemide (LASIX) 20 MG tablet, TAKE 1 TABLET BY MOUTH DAILY., Disp: 90 tablet, Rfl: 1 .  Multiple Vitamin (MULTIVITAMIN) tablet, Take 1 tablet by mouth daily., Disp: , Rfl:  .  simvastatin (ZOCOR) 20 MG tablet, Take 1.5 tablets by mouth at bedtime, Disp: 135 tablet, Rfl: 1  Current Facility-Administered Medications:  .  0.9 %  sodium chloride infusion, 500 mL, Intravenous, Continuous, Jerene Bears, MD  EXAM:  Vitals:   04/27/16 1603  BP: 118/60  Pulse: 71  Temp: 98.2 F (36.8 C)    Body mass index is 43.09 kg/m.  GENERAL: vitals reviewed and listed above, alert, oriented, appears well hydrated and in no acute distress  HEENT: atraumatic, conjunttiva clear, no obvious abnormalities on inspection of external nose and ears  NECK: no obvious masses on inspection  LUNGS: clear to auscultation bilaterally, no wheezes, rales or rhonchi, good air movement  CV: HRRR, no peripheral edema  MS: moves  all extremities without noticeable abnormality  PSYCH: pleasant and cooperative, no obvious depression or anxiety  ASSESSMENT AND PLAN:  Discussed the following assessment and plan:  BMI > 40  Essential hypertension  Hyperlipidemia, unspecified hyperlipidemia type  Gastroesophageal reflux disease, esophagitis presence not specified  -lifestyle recommendations discussed at length - advise med diet, regular exercise, labs next visit -flu shot -cont current meds for now -ppi otc dose for 2-4 weeks for GERD, advised follow up if persists, requires persistent ppi use, other concerns -Patient advised to return or notify a doctor immediately if symptoms worsen or persist or new concerns arise.  Patient Instructions  BEFORE YOU LEAVE: -follow  up: in 3 months; come fasting if possible -flu shot  Nexium once daily. Follow up if acid symptoms not improving.    The Mediterranean Diet is great for reducing cardiovascular risks, improving health and helping with weight loss. The new weight watchers program is also great! For help with the Mediterranean Diet this website is helpful: www.medinsteadofmeds.com  We recommend the following healthy lifestyle for LIFE: 1) Small portions.   Tip: eat off of a salad plate instead of a dinner plate.  Tip: It is ok to feel hungry after a meal - that likely means you ate an appropriate portion.  Tip: if you need more or a snack choose fruits, veggies and/or a handful of nuts or seeds.  2) Eat a healthy clean diet.  * Tip: Avoid (less then 1 serving per week): processed foods, sweets, sweetened drinks, white starches (rice, flour, bread, potatoes, pasta, etc), red meat, fast foods, butter  *Tip: CHOOSE instead   * 5-9 servings per day of fresh or frozen fruits and vegetables (but not corn, potatoes, bananas, canned or dried fruit)   *nuts and seeds, beans   *olives and olive oil   *small portions of lean meats such as fish and white chicken    *small portions of whole grains  3)Get at least 150 minutes of sweaty aerobic exercise per week.  4)Reduce stress - consider counseling, meditation and relaxation to balance other aspects of your life.      Colin Benton R., DO

## 2016-05-11 ENCOUNTER — Encounter: Payer: Self-pay | Admitting: Family Medicine

## 2016-05-11 ENCOUNTER — Ambulatory Visit (INDEPENDENT_AMBULATORY_CARE_PROVIDER_SITE_OTHER): Payer: 59 | Admitting: Family Medicine

## 2016-05-11 ENCOUNTER — Ambulatory Visit (INDEPENDENT_AMBULATORY_CARE_PROVIDER_SITE_OTHER): Payer: 59

## 2016-05-11 VITALS — BP 122/84 | HR 61 | Temp 98.7°F | Resp 17 | Ht 68.0 in | Wt 290.0 lb

## 2016-05-11 DIAGNOSIS — M79674 Pain in right toe(s): Secondary | ICD-10-CM | POA: Diagnosis not present

## 2016-05-11 DIAGNOSIS — M109 Gout, unspecified: Secondary | ICD-10-CM | POA: Diagnosis not present

## 2016-05-11 LAB — BASIC METABOLIC PANEL
BUN: 23 mg/dL (ref 7–25)
CALCIUM: 9.4 mg/dL (ref 8.6–10.3)
CO2: 27 mmol/L (ref 20–31)
CREATININE: 1.04 mg/dL (ref 0.70–1.33)
Chloride: 106 mmol/L (ref 98–110)
GLUCOSE: 104 mg/dL — AB (ref 65–99)
POTASSIUM: 4.5 mmol/L (ref 3.5–5.3)
Sodium: 142 mmol/L (ref 135–146)

## 2016-05-11 LAB — URIC ACID: Uric Acid, Serum: 7.7 mg/dL (ref 4.0–8.0)

## 2016-05-11 MED ORDER — PREDNISONE 20 MG PO TABS: 60.0000 mg | ORAL_TABLET | Freq: Every day | ORAL | 0 refills | Status: DC

## 2016-05-11 MED ORDER — TRAMADOL HCL 50 MG PO TABS: 50.0000 mg | ORAL_TABLET | Freq: Three times a day (TID) | ORAL | 0 refills | Status: DC | PRN

## 2016-05-11 MED ORDER — INDOMETHACIN 50 MG PO CAPS: 50.0000 mg | ORAL_CAPSULE | Freq: Three times a day (TID) | ORAL | 0 refills | Status: DC

## 2016-05-11 MED FILL — traMADol HCL 50 MG TABS: 50 | 10 days supply | Qty: 30 | Fill #0

## 2016-05-11 MED FILL — predniSONE 20 MG TABS: 20 | 5 days supply | Qty: 15 | Fill #0

## 2016-05-11 MED FILL — INDOMETHACIN 50 MG CAPSULE: 50 | 10 days supply | Qty: 30 | Fill #0

## 2016-05-11 NOTE — Progress Notes (Signed)
Chief Complaint  Patient presents with  . Foot Pain    Rt onset 6 days/ red swollen painful to touch around big toe and top of foot    HPI  Pt reports that he developed pain in his great toe on the right foot mostly centered around the big toe Pain today is 7/10 with walking He reports that 6 days ago the pain was 2/10 He states that the pain is throbbing in character  He denies a history of gout He reports that he has not consumed any alcohol in the last 3-4 weeks.   Past Medical History:  Diagnosis Date  . Abnormal glucose   . Arthritis   . Cataract    surgically removed right eye   . ED (erectile dysfunction) of organic origin   . Glucose intolerance (malabsorption)   . Hyperlipidemia   . Hypertension   . Obesity   . PVC (premature ventricular contraction)   . Sleep apnea    reports used CPAP in the past, was loosing weight  . Umbilical hernia    ventral hernia, following GB surgery    Current Outpatient Prescriptions  Medication Sig Dispense Refill  . aspirin 81 MG tablet Take 81 mg by mouth daily.      . furosemide (LASIX) 20 MG tablet TAKE 1 TABLET BY MOUTH DAILY. 90 tablet 1  . Multiple Vitamin (MULTIVITAMIN) tablet Take 1 tablet by mouth daily.    . simvastatin (ZOCOR) 20 MG tablet Take 1.5 tablets by mouth at bedtime 135 tablet 1  . indomethacin (INDOCIN) 50 MG capsule Take 1 capsule (50 mg total) by mouth 3 (three) times daily with meals. 30 capsule 0  . predniSONE (DELTASONE) 20 MG tablet Take 3 tablets (60 mg total) by mouth daily with breakfast. 10 tablet 0   Current Facility-Administered Medications  Medication Dose Route Frequency Provider Last Rate Last Dose  . 0.9 %  sodium chloride infusion  500 mL Intravenous Continuous Jerene Bears, MD        Allergies: No Known Allergies  Past Surgical History:  Procedure Laterality Date  . APPENDECTOMY    . mouth growth removal   03/16/2016  . TONSILLECTOMY      Social History   Social History  .  Marital status: Married    Spouse name: N/A  . Number of children: N/A  . Years of education: N/A   Social History Main Topics  . Smoking status: Never Smoker  . Smokeless tobacco: Never Used  . Alcohol use 1.2 - 3.6 oz/week    2 - 6 Cans of beer per week  . Drug use: No  . Sexual activity: Not Asked   Other Topics Concern  . None   Social History Narrative  . None    ROS  Objective: Vitals:   05/11/16 1116  BP: 122/84  Pulse: 61  Resp: 17  Temp: 98.7 F (37.1 C)  TempSrc: Oral  SpO2: 97%  Weight: 290 lb (131.5 kg)  Height: 5\' 8"  (1.727 m)   Body mass index is 44.09 kg/m.  Gen: alert and oriented, in NAD Heart: NSR, no murmurs Resp: CTAB, no wheezing 1st MTP of the right foot with erythema and edema  exquisitely tender to palpation DP and PT 2+ on the right foot Left foot normal   CLINICAL DATA:  Right great toe pain for 4 days. No known injury or history of gout.  EXAM: RIGHT GREAT TOE  COMPARISON:  None.  FINDINGS: The mineralization  and alignment are normal. There is no evidence of acute fracture or dislocation. There are moderate degenerative changes at the first metatarsal phalangeal joint with osteophytes and joint space narrowing. There are mild degenerative changes of the sesamoids. No erosive changes or soft tissue calcifications are evident. There may be some medial forefoot soft tissue swelling.  IMPRESSION: Moderate first MTP degenerative changes without acute osseous findings or specific radiographic evidence of gout.   Electronically Signed   By: Richardean Sale M.D.   On: 05/11/2016 11:15  Physical Exam  Assessment and Plan Dair was seen today for foot pain.  Diagnoses and all orders for this visit:  Pain of right great toe -     DG Toe Great Right; Future  Podagra- new problem requiring additional work up including xray of the great toe No clear evidence of gout Discussed that if there is no improvement of if  he has repeated episodes will get fluid analysis Due to patient discomfort, deferred today -     Uric Acid -     Basic metabolic panel  Other orders -     predniSONE (DELTASONE) 20 MG tablet; Take 3 tablets (60 mg total) by mouth daily with breakfast. -     indomethacin (INDOCIN) 50 MG capsule; Take 1 capsule (50 mg total) by mouth 3 (three) times daily with meals.  Morbid obesity - discussed that weight loss and lifestyle modification can help prevent recurrent attacks   Lejla Moeser A Nolon Rod

## 2016-05-11 NOTE — Patient Instructions (Addendum)
Take Prednisone 60mg  (3 tablets) daily for 5 days with food Take the Indomethacin 3 times a day for pain and inflammation for the next 5 days. Take Tramadol for break through pain.   IF you received an x-ray today, you will receive an invoice from [facility]. Please contact [facility] at [phone] with questions or concerns regarding your invoice.   IF you received labwork today, you will receive an invoice from [employer]. Please contact [facility] at [phone] with questions or concerns regarding your invoice.   Our billing staff will not be able to assist you with questions regarding bills from these companies.  You will be contacted with the lab results as soon as they are available. The fastest way to get your results is to activate your My Chart account. Instructions are located on the last page of this paperwork. If you have not heard from [place] regarding the results in 2 weeks, please contact this office.     Gout Gout is an inflammatory arthritis caused by a buildup of uric acid crystals in the joints. Uric acid is a chemical that is normally present in the blood. When the level of uric acid in the blood is too high it can form crystals that deposit in your joints and tissues. This causes joint redness, soreness, and swelling (inflammation). Repeat attacks are common. Over time, uric acid crystals can form into masses (tophi) near a joint, destroying bone and causing disfigurement. Gout is treatable and often preventable. CAUSES  The disease begins with elevated levels of uric acid in the blood. Uric acid is produced by your body when it breaks down a naturally found substance called purines. Certain foods you eat, such as meats and fish, contain high amounts of purines. Causes of an elevated uric acid level include:  Being passed down from parent to child (heredity).  Diseases that cause increased uric acid production (such as obesity,  psoriasis, and certain cancers).  Excessive alcohol use.  Diet, especially diets rich in meat and seafood.  Medicines, including certain cancer-fighting medicines (chemotherapy), water pills (diuretics), and aspirin.  Chronic kidney disease. The kidneys are no longer able to remove uric acid well.  Problems with metabolism. Conditions strongly associated with gout include:  Obesity.  High blood pressure.  High cholesterol.  Diabetes. Not everyone with elevated uric acid levels gets gout. It is not understood why some people get gout and others do not. Surgery, joint injury, and eating too much of certain foods are some of the factors that can lead to gout attacks. SYMPTOMS   An attack of gout comes on quickly. It causes intense pain with redness, swelling, and warmth in a joint.  Fever can occur.  Often, only one joint is involved. Certain joints are more commonly involved:  Base of the big toe.  Knee.  Ankle.  Wrist.  Finger. Without treatment, an attack usually goes away in a few days to weeks. Between attacks, you usually will not have symptoms, which is different from many other forms of arthritis. DIAGNOSIS  Your caregiver will suspect gout based on your symptoms and exam. In some cases, tests may be recommended. The tests may include:  Blood tests.  Urine tests.  X-rays.  Joint fluid exam. This exam requires a needle to remove fluid from the joint (arthrocentesis). Using a microscope, gout is confirmed when uric acid crystals are seen in the joint fluid. TREATMENT  There are two phases to gout treatment: treating the sudden onset (  acute) attack and preventing attacks (prophylaxis).  Treatment of an Acute Attack.  Medicines are used. These include anti-inflammatory medicines or steroid medicines.  An injection of steroid medicine into the affected joint is sometimes necessary.  The painful joint is rested. Movement can worsen the arthritis.  You may  use warm or cold treatments on painful joints, depending which works best for you.  Treatment to Prevent Attacks.  If you suffer from frequent gout attacks, your caregiver may advise preventive medicine. These medicines are started after the acute attack subsides. These medicines either help your kidneys eliminate uric acid from your body or decrease your uric acid production. You may need to stay on these medicines for a very long time.  The early phase of treatment with preventive medicine can be associated with an increase in acute gout attacks. For this reason, during the first few months of treatment, your caregiver may also advise you to take medicines usually used for acute gout treatment. Be sure you understand your caregiver's directions. Your caregiver may make several adjustments to your medicine dose before these medicines are effective.  Discuss dietary treatment with your caregiver or dietitian. Alcohol and drinks high in sugar and fructose and foods such as meat, poultry, and seafood can increase uric acid levels. Your caregiver or dietitian can advise you on drinks and foods that should be limited. HOME CARE INSTRUCTIONS   Do not take aspirin to relieve pain. This raises uric acid levels.  Only take over-the-counter or prescription medicines for pain, discomfort, or fever as directed by your caregiver.  Rest the joint as much as possible. When in bed, keep sheets and blankets off painful areas.  Keep the affected joint raised (elevated).  Apply warm or cold treatments to painful joints. Use of warm or cold treatments depends on which works best for you.  Use crutches if the painful joint is in your leg.  Drink enough fluids to keep your urine clear or pale yellow. This helps your body get rid of uric acid. Limit alcohol, sugary drinks, and fructose drinks.  Follow your dietary instructions. Pay careful attention to the amount of protein you eat. Your daily diet should  emphasize fruits, vegetables, whole grains, and fat-free or low-fat milk products. Discuss the use of coffee, vitamin C, and cherries with your caregiver or dietitian. These may be helpful in lowering uric acid levels.  Maintain a healthy body weight. SEEK MEDICAL CARE IF:   You develop diarrhea, vomiting, or any side effects from medicines.  You do not feel better in 24 hours, or you are getting worse. SEEK IMMEDIATE MEDICAL CARE IF:   Your joint becomes suddenly more tender, and you have chills or a fever. MAKE SURE YOU:   Understand these instructions.  Will watch your condition.  Will get help right away if you are not doing well or get worse.   This information is not intended to replace advice given to you by your health care provider. Make sure you discuss any questions you have with your health care provider.   Document Released: 07/10/2000 Document Revised: 08/03/2014 Document Reviewed: 02/24/2012 Elsevier Interactive Patient Education [employer].

## 2016-05-19 ENCOUNTER — Encounter: Payer: Self-pay | Admitting: Family Medicine

## 2016-05-19 ENCOUNTER — Ambulatory Visit (INDEPENDENT_AMBULATORY_CARE_PROVIDER_SITE_OTHER): Payer: 59 | Admitting: Family Medicine

## 2016-05-19 VITALS — BP 120/76 | HR 74 | Temp 98.7°F | Wt 286.6 lb

## 2016-05-19 DIAGNOSIS — M79671 Pain in right foot: Secondary | ICD-10-CM | POA: Diagnosis not present

## 2016-05-19 NOTE — Patient Instructions (Signed)
Can use aleve or ibuprofen as needed per instructions for pain  Please give [place] an update in 1 week  Follow up if any worsening, does not resolve and if recurrent episodes

## 2016-05-19 NOTE — Progress Notes (Signed)
HPI:  Acute visit for:  ? Gout: -had acute onset of R 1st MTP joint pain, swelling and some redness 1 week ago after long mowing period -went to urgent care and treated with indomethacin AND prednisone (these caused nausea) but he finished course -doing much better and no nausea now that of meds - still with some pain -xray showed degenrative jt disease w/out any signs of gout and uric acid at the time was negative -he has no FH or personal hx of gout or similar episode and had not been drinking alcohol -he is making dietary changes since the last visit and signed up for wt watchers today -no fevers, malaise, vomiting, skin redness over the rest of the foot  ROS: See pertinent positives and negatives per HPI.  Past Medical History:  Diagnosis Date  . Abnormal glucose   . Arthritis   . Cataract    surgically removed right eye   . ED (erectile dysfunction) of organic origin   . Glucose intolerance (malabsorption)   . Hyperlipidemia   . Hypertension   . Obesity   . PVC (premature ventricular contraction)   . Sleep apnea    reports used CPAP in the past, was loosing weight  . Umbilical hernia    ventral hernia, following GB surgery    Past Surgical History:  Procedure Laterality Date  . APPENDECTOMY    . mouth growth removal   03/16/2016  . TONSILLECTOMY      Family History  Problem Relation Age of Onset  . Bipolar disorder Mother   . Arthritis Mother   . Colon polyps Mother   . Hyperlipidemia Mother   . Hypertension Mother   . Colon polyps Father   . Hyperlipidemia Father   . Hypertension Father   . Intracerebral hemorrhage    . Colon cancer Neg Hx   . Esophageal cancer Neg Hx   . Rectal cancer Neg Hx   . Stomach cancer Neg Hx     Social History   Social History  . Marital status: Married    Spouse name: N/A  . Number of children: N/A  . Years of education: N/A   Social History Main Topics  . Smoking status: Never Smoker  . Smokeless tobacco: Never  Used  . Alcohol use 1.2 - 3.6 oz/week    2 - 6 Cans of beer per week  . Drug use: No  . Sexual activity: Not Asked   Other Topics Concern  . None   Social History Narrative  . None     Current Outpatient Prescriptions:  .  aspirin 81 MG tablet, Take 81 mg by mouth daily.  , Disp: , Rfl:  .  furosemide (LASIX) 20 MG tablet, TAKE 1 TABLET BY MOUTH DAILY., Disp: 90 tablet, Rfl: 1 .  indomethacin (INDOCIN) 50 MG capsule, Take 1 capsule (50 mg total) by mouth 3 (three) times daily with meals., Disp: 30 capsule, Rfl: 0 .  Multiple Vitamin (MULTIVITAMIN) tablet, Take 1 tablet by mouth daily., Disp: , Rfl:  .  predniSONE (DELTASONE) 20 MG tablet, Take 3 tablets (60 mg total) by mouth daily with breakfast., Disp: 15 tablet, Rfl: 0 .  simvastatin (ZOCOR) 20 MG tablet, Take 1.5 tablets by mouth at bedtime, Disp: 135 tablet, Rfl: 1 .  traMADol (ULTRAM) 50 MG tablet, Take 1 tablet (50 mg total) by mouth every 8 (eight) hours as needed., Disp: 30 tablet, Rfl: 0  Current Facility-Administered Medications:  .  0.9 %  sodium  chloride infusion, 500 mL, Intravenous, Continuous, Jerene Bears, MD  EXAM:  Vitals:   05/19/16 1714  BP: 120/76  Pulse: 74  Temp: 98.7 F (37.1 C)    Body mass index is 43.58 kg/m.  GENERAL: vitals reviewed and listed above, alert, oriented, appears well hydrated and in no acute distress  HEENT: atraumatic, conjunttiva clear, no obvious abnormalities on inspection of external nose and ears  NECK: no obvious masses on inspection  MS: moves all extremities without noticeable abnormality; on inspection of the right foot he has minimal swelling of the right first and MTP joint without warmth or significant erythema, he has some tenderness to palpation around this joint, normal movements of the toes foot and ankle, good cap refill  PSYCH: pleasant and cooperative, no obvious depression or anxiety  ASSESSMENT AND PLAN:  Discussed the following assessment and  plan:  Right foot pain  -we discussed possible serious and likely etiologies, workup and treatment, treatment risks and return precautions -reviewed xray and uric acid results -Most likely gout or osteoarthritis of the toe -He seems to be doing much better and tolerates over-the-counter NSAIDs better then the other options so will continue with these for 1 week -Discussed a healthy gout griendly diet and congratulated on progress to initiate wt loss -check uric acid level again in 1-2 weeks, he plans to email with update -if persistent or recurrent will have him see ortho as he seems to want a definitive dx -Patient advised to return or notify a doctor immediately if symptoms worsen or persist or new concerns arise.  Patient Instructions  Can use aleve or ibuprofen as needed per instructions for pain  Please give [place] an update in 1 week  Follow up if any worsening, does not resolve and if recurrent episodes   Colin Benton R., DO

## 2016-05-27 ENCOUNTER — Other Ambulatory Visit: Payer: Self-pay | Admitting: Family Medicine

## 2016-05-28 MED FILL — FUROSEMIDE 20 MG TABLET: 20 | 90 days supply | Qty: 90 | Fill #0

## 2016-05-29 DIAGNOSIS — G4733 Obstructive sleep apnea (adult) (pediatric): Secondary | ICD-10-CM | POA: Diagnosis not present

## 2016-07-29 DIAGNOSIS — M1990 Unspecified osteoarthritis, unspecified site: Secondary | ICD-10-CM | POA: Insufficient documentation

## 2016-07-29 NOTE — Progress Notes (Signed)
HPI:  Travis Martinez is a pleasant 56 yo with a PMH significant for Obesity, OA multiple sites, HTN, hyperlipidemia and OSA here for follow up. In terms of his chronic medical problems, he has been doing great. He has joined [employer] and is doing the Mediterranean diet and has lost 25 pounds! He reports he really hasn't braced this new lifestyle. He has several new issues today:  1) left great toe MCP joint pain: -Started 3 days ago -No redness, swelling, weakness or numbness -Wonders if this is gout  2) lump on posterior neck: -Reports diagnosed with "inverted sweat gland" by prior PCP -Has had this for many years -Reports it drained malodorous material from time to time -Desires removal  3) URI: -Started about 2 weeks ago -Symptoms include nasal congestion, cough and sore throat -Denies fevers, shortness of breath, sinus pain, body aches, nausea, vomiting or diarrhea, worsening - status post tonsillectomy -Main concern now is postnasal drip and cough - does not feel needs a cough medication  Due for tetanus booster.  ROS: See pertinent positives and negatives per HPI.  Past Medical History:  Diagnosis Date  . Abnormal glucose   . Arthritis   . Cataract    surgically removed right eye   . ED (erectile dysfunction) of organic origin   . Glucose intolerance (malabsorption)   . Hyperlipidemia   . Hypertension   . Obesity   . PVC (premature ventricular contraction)   . Sleep apnea    reports used CPAP in the past, was loosing weight  . Umbilical hernia    ventral hernia, following GB surgery    Past Surgical History:  Procedure Laterality Date  . APPENDECTOMY    . mouth growth removal   03/16/2016  . TONSILLECTOMY      Family History  Problem Relation Age of Onset  . Bipolar disorder Mother   . Arthritis Mother   . Colon polyps Mother   . Hyperlipidemia Mother   . Hypertension Mother   . Colon polyps Father   . Hyperlipidemia Father   . Hypertension  Father   . Intracerebral hemorrhage    . Colon cancer Neg Hx   . Esophageal cancer Neg Hx   . Rectal cancer Neg Hx   . Stomach cancer Neg Hx     Social History   Social History  . Marital status: Married    Spouse name: N/A  . Number of children: N/A  . Years of education: N/A   Social History Main Topics  . Smoking status: Never Smoker  . Smokeless tobacco: Never Used  . Alcohol use 1.2 - 3.6 oz/week    2 - 6 Cans of beer per week  . Drug use: No  . Sexual activity: Not Asked   Other Topics Concern  . None   Social History Narrative  . None     Current Outpatient Prescriptions:  .  aspirin 81 MG tablet, Take 81 mg by mouth daily.  , Disp: , Rfl:  .  indomethacin (INDOCIN) 50 MG capsule, Take 1 capsule (50 mg total) by mouth 3 (three) times daily with meals., Disp: 30 capsule, Rfl: 0 .  Multiple Vitamin (MULTIVITAMIN) tablet, Take 1 tablet by mouth daily., Disp: , Rfl:  .  simvastatin (ZOCOR) 20 MG tablet, Take 1.5 tablets by mouth at bedtime, Disp: 135 tablet, Rfl: 1  Current Facility-Administered Medications:  .  0.9 %  sodium chloride infusion, 500 mL, Intravenous, Continuous, Jerene Bears, MD  EXAM:  Vitals:   07/30/16 0904  BP: 100/78  Pulse: 75  Temp: 97.6 F (36.4 C)    Body mass index is 40.51 kg/m.  GENERAL: vitals reviewed and listed above, alert, oriented, appears well hydrated and in no acute distress  HEENT: atraumatic, conjunttiva clear, no obvious abnormalities on inspection of external nose and ears, normal appearance of ear canals and TMs, clear nasal congestion, mild post oropharyngeal erythema with PND, no tonsillar edema or exudate, no sinus TTP  NECK: no obvious masses on inspection  LUNGS: clear to auscultation bilaterally, no wheezes, rales or rhonchi, good air movement  CV: HRRR, no peripheral edema  MS: moves all extremities without noticeable abnormality, normal inspection of the feet, no redness, swelling or warmth to the  joint in question. Normal strength in Refill. Normal sensitivity to light touch. Does have some tenderness to palpation around the left first MCP joint.  SKIN: Approximately 1.52 cm in diameter subcutaneous mobile cystlike structure in the left upper back  PSYCH: pleasant and cooperative, no obvious depression or anxiety  ASSESSMENT AND PLAN:  Discussed the following assessment and plan:  Great toe pain, left -likely OA, ? Gout, tx with firm shoes and short course nsaids after discussion risks -advise ortho for tap if recurrence in case may need prophylactic gout medication -current exam, xray, labs did not confirm gout  Viral upper respiratory tract infection -likely viral given exam findings -he is to call if worsening or persists in 1 week, then may need to do abx incase bacterial sinusitis  Sebaceous cyst -discussed options, he may see if his dermatologist can remove, if not he will call for surgery referrla  Essential hypertension Hyperlipidemia, unspecified hyperlipidemia type BMI > 40 Obstructive sleep apnea -congratulated on lifestyle changes and supported and counseled -advised lifelong healthy lifestyle  -Patient advised to return or notify a doctor immediately if symptoms worsen or persist or new concerns arise.  Patient Instructions  BEFORE YOU LEAVE: -follow up: 3 months.  For the toe pain: -aleve 1-2 tablets twice daily for 1 week -ortho urgent care if recurs for tap  For the sebaceous cyst on the neck - please let us know if derm can not remove and we can place referral to surgery.  For the upper respiratory symptoms: -nasal saline, cough drops, salt water gargles, etc -please follow up if worsening, sinus pain, fevers, trouble breathing or if not improving over the next week  Congrats on the new lifestyle!!! So happy for you!    Colin Benton R., DO

## 2016-07-30 ENCOUNTER — Encounter: Payer: Self-pay | Admitting: Family Medicine

## 2016-07-30 ENCOUNTER — Ambulatory Visit (INDEPENDENT_AMBULATORY_CARE_PROVIDER_SITE_OTHER): Payer: 59 | Admitting: Family Medicine

## 2016-07-30 VITALS — BP 100/78 | HR 75 | Temp 97.6°F | Ht 68.0 in | Wt 266.4 lb

## 2016-07-30 DIAGNOSIS — I10 Essential (primary) hypertension: Secondary | ICD-10-CM

## 2016-07-30 DIAGNOSIS — M79675 Pain in left toe(s): Secondary | ICD-10-CM

## 2016-07-30 DIAGNOSIS — L723 Sebaceous cyst: Secondary | ICD-10-CM

## 2016-07-30 DIAGNOSIS — J069 Acute upper respiratory infection, unspecified: Secondary | ICD-10-CM

## 2016-07-30 DIAGNOSIS — G4733 Obstructive sleep apnea (adult) (pediatric): Secondary | ICD-10-CM

## 2016-07-30 DIAGNOSIS — B9789 Other viral agents as the cause of diseases classified elsewhere: Secondary | ICD-10-CM

## 2016-07-30 DIAGNOSIS — E785 Hyperlipidemia, unspecified: Secondary | ICD-10-CM

## 2016-07-30 NOTE — Patient Instructions (Addendum)
BEFORE YOU LEAVE: -follow up: 3 months.  For the toe pain: -aleve 1-2 tablets twice daily for 1 week -ortho urgent care if recurs for tap  For the sebaceous cyst on the neck - please let us know if derm can not remove and we can place referral to surgery.  For the upper respiratory symptoms: -nasal saline, cough drops, salt water gargles, etc -please follow up if worsening, sinus pain, fevers, trouble breathing or if not improving over the next week  Congrats on the new lifestyle!!! So happy for you!

## 2016-07-30 NOTE — Progress Notes (Signed)
Pre visit review using our clinic review tool, if applicable. No additional management support is needed unless otherwise documented below in the visit note. 

## 2016-08-06 ENCOUNTER — Other Ambulatory Visit: Payer: Self-pay | Admitting: Family Medicine

## 2016-08-06 MED FILL — SIMVASTATIN 20 MG TABLET: 20 | 90 days supply | Qty: 135 | Fill #0

## 2016-08-18 ENCOUNTER — Encounter: Payer: Self-pay | Admitting: Family Medicine

## 2016-09-02 MED FILL — FUROSEMIDE 20 MG TABLET: 20 | 90 days supply | Qty: 90 | Fill #1

## 2016-11-05 DIAGNOSIS — G4733 Obstructive sleep apnea (adult) (pediatric): Secondary | ICD-10-CM | POA: Diagnosis not present

## 2016-12-10 ENCOUNTER — Telehealth: Payer: Self-pay

## 2016-12-10 NOTE — Telephone Encounter (Signed)
Received PA request from [facility] for Simvastatin. PA submitted & is pending. Key: [id number]

## 2016-12-14 ENCOUNTER — Other Ambulatory Visit: Payer: Self-pay | Admitting: Family Medicine

## 2016-12-15 NOTE — Telephone Encounter (Signed)
PA approved, form faxed back to pharmacy. 

## 2017-01-06 ENCOUNTER — Encounter: Payer: Self-pay | Admitting: Family Medicine

## 2017-01-06 MED FILL — SIMVASTATIN 20 MG TABLET: 20 | 90 days supply | Qty: 135 | Fill #1

## 2017-01-06 MED FILL — FUROSEMIDE 20 MG TABLET: 20 | 90 days supply | Qty: 90 | Fill #0

## 2017-02-08 DIAGNOSIS — G4733 Obstructive sleep apnea (adult) (pediatric): Secondary | ICD-10-CM | POA: Diagnosis not present

## 2017-03-17 ENCOUNTER — Emergency Department (HOSPITAL_COMMUNITY): Payer: 59

## 2017-03-17 ENCOUNTER — Encounter (HOSPITAL_COMMUNITY): Payer: Self-pay

## 2017-03-17 ENCOUNTER — Emergency Department (HOSPITAL_COMMUNITY)
Admission: EM | Admit: 2017-03-17 | Discharge: 2017-03-17 | Disposition: A | Discharge status: Home / Self Care | Payer: 59 | Attending: Emergency Medicine | Admitting: Emergency Medicine

## 2017-03-17 DIAGNOSIS — R42 Dizziness and giddiness: Secondary | ICD-10-CM | POA: Insufficient documentation

## 2017-03-17 DIAGNOSIS — R0602 Shortness of breath: Secondary | ICD-10-CM | POA: Insufficient documentation

## 2017-03-17 DIAGNOSIS — Z79899 Other long term (current) drug therapy: Secondary | ICD-10-CM | POA: Insufficient documentation

## 2017-03-17 DIAGNOSIS — R51 Headache: Secondary | ICD-10-CM | POA: Insufficient documentation

## 2017-03-17 DIAGNOSIS — Z7982 Long term (current) use of aspirin: Secondary | ICD-10-CM | POA: Diagnosis not present

## 2017-03-17 DIAGNOSIS — H538 Other visual disturbances: Secondary | ICD-10-CM | POA: Diagnosis not present

## 2017-03-17 DIAGNOSIS — I10 Essential (primary) hypertension: Secondary | ICD-10-CM | POA: Insufficient documentation

## 2017-03-17 DIAGNOSIS — R519 Headache, unspecified: Secondary | ICD-10-CM

## 2017-03-17 DIAGNOSIS — R0789 Other chest pain: Secondary | ICD-10-CM | POA: Diagnosis not present

## 2017-03-17 DIAGNOSIS — H532 Diplopia: Secondary | ICD-10-CM | POA: Diagnosis not present

## 2017-03-17 LAB — RAPID URINE DRUG SCREEN, HOSP PERFORMED
AMPHETAMINES: NOT DETECTED
Barbiturates: NOT DETECTED
Benzodiazepines: NOT DETECTED
Cocaine: NOT DETECTED
OPIATES: NOT DETECTED
TETRAHYDROCANNABINOL: NOT DETECTED

## 2017-03-17 LAB — I-STAT CHEM 8, ED
BUN: 17 mg/dL (ref 6–20)
Calcium, Ion: 1.11 mmol/L — ABNORMAL LOW (ref 1.15–1.40)
Chloride: 103 mmol/L (ref 101–111)
Creatinine, Ser: 0.9 mg/dL (ref 0.61–1.24)
GLUCOSE: 169 mg/dL — AB (ref 65–99)
HCT: 48 % (ref 39.0–52.0)
HEMOGLOBIN: 16.3 g/dL (ref 13.0–17.0)
POTASSIUM: 3.6 mmol/L (ref 3.5–5.1)
Sodium: 139 mmol/L (ref 135–145)
TCO2: 26 mmol/L (ref 0–100)

## 2017-03-17 LAB — CBC
HCT: 46.1 % (ref 39.0–52.0)
Hemoglobin: 16.5 g/dL (ref 13.0–17.0)
MCH: 30.8 pg (ref 26.0–34.0)
MCHC: 35.8 g/dL (ref 30.0–36.0)
MCV: 86 fL (ref 78.0–100.0)
PLATELETS: 167 10*3/uL (ref 150–400)
RBC: 5.36 MIL/uL (ref 4.22–5.81)
RDW: 13 % (ref 11.5–15.5)
WBC: 6.4 10*3/uL (ref 4.0–10.5)

## 2017-03-17 LAB — CBG MONITORING, ED
GLUCOSE-CAPILLARY: 192 mg/dL — AB (ref 65–99)
Glucose-Capillary: 173 mg/dL — ABNORMAL HIGH (ref 65–99)

## 2017-03-17 LAB — URINALYSIS, ROUTINE W REFLEX MICROSCOPIC
BILIRUBIN URINE: NEGATIVE
Glucose, UA: NEGATIVE mg/dL
HGB URINE DIPSTICK: NEGATIVE
Ketones, ur: NEGATIVE mg/dL
Leukocytes, UA: NEGATIVE
Nitrite: NEGATIVE
PROTEIN: NEGATIVE mg/dL
Specific Gravity, Urine: 1.011 (ref 1.005–1.030)
pH: 6 (ref 5.0–8.0)

## 2017-03-17 LAB — I-STAT TROPONIN, ED: Troponin i, poc: 0 ng/mL (ref 0.00–0.08)

## 2017-03-17 LAB — ETHANOL: Alcohol, Ethyl (B): <5 mg/dL (ref <5)

## 2017-03-17 MED ORDER — ACETAMINOPHEN 325 MG PO TABS
650.0000 mg | ORAL_TABLET | Freq: Once | ORAL | Status: AC
  Administered 2017-03-17: 650 mg via ORAL
  Filled 2017-03-17: qty 2

## 2017-03-17 MED ORDER — SODIUM CHLORIDE 0.9 % IV SOLN
INTRAVENOUS | Status: DC
  Administered 2017-03-17: 15:00:00 via INTRAVENOUS

## 2017-03-17 NOTE — ED Notes (Signed)
Vital signs delayed due to patient being in MRI.  

## 2017-03-17 NOTE — ED Notes (Signed)
Vital signs delayed due to patient being in MRI.

## 2017-03-17 NOTE — ED Notes (Signed)
Pt is having some anxiety regarding the MRI  MD made aware

## 2017-03-17 NOTE — ED Triage Notes (Addendum)
Patient stated an hour ago he developed blurred vision, double vision and felt like he was disoriented.patient stated the blurred vision lasted 45 minutes. Patient stated he was going to drive himself and felt like he was going to pass out. Patient also c/o chest tightness that started less than an hour ago. Patient denies any N/V or diaphoresis.

## 2017-03-17 NOTE — ED Notes (Signed)
MD at the bedside  

## 2017-03-17 NOTE — ED Notes (Signed)
When pt ambulated to CT he reports walking was difficult on the way back. Pt reports he usually works out and this is an odd feeling for him to feel so short of breath

## 2017-03-17 NOTE — ED Provider Notes (Signed)
I assumed care of this patient from Dr. Rogene Houston at 1700.  Please see their note for further details of Hx, PE.  Briefly patient is a 56 y.o. male who presents with sudden onset of visual changes which appear to be in rapidly improving. Patient also with lightheadedness and has not developed a frontal headache. CT head without acute findings. Currently awaiting an MRI to rule out stroke. Current plan is to follow-up MRI findings.   Additionally patient was having anterior chest wall pain which is secondary to MSK. EKG was nonischemic and troponin was negative. No additional workup required.   MRI negative for acute findings. Symptoms have completely resolved. Recommended close PCP follow-up.  The patient is safe for discharge with strict return precautions.  Disposition: Discharge  Condition: Good  I have discussed the results, Dx and Tx plan with the patient who expressed understanding and agree(s) with the plan. Discharge instructions discussed at great length. The patient was given strict return precautions who verbalized understanding of the instructions. No further questions at time of discharge.    New Prescriptions   No medications on file    Follow Up: Lucretia Kern, DO [place] [id number] [phone]  Schedule an appointment as soon as possible for a visit  If symptoms do not improve or  worsen        Cardama, Grayce Sessions, MD 03/17/17 2044

## 2017-03-17 NOTE — ED Provider Notes (Signed)
[facility] DEPT Provider Note   CSN: [id number] Arrival date & time: 03/17/17  1315     History   Chief Complaint Chief Complaint  Patient presents with  . Blurred Vision  . Chest Pain  . Near Syncope    HPI Travis Martinez is a 56 y.o. male.  Patient was feeling fine until around 12:30. He had sudden onset of bright light visual disturbance and then vision became blurry. Seem to be bilateral. Slight photophobia. Patient without a history of migraines. After arrival here ofwhich would've been around 1:30 developed a frontal headache. Patient also stated that he thought his speech was off and that he felt foggy in the head. Patient was at the at work at the time that this occurred. Patient also felt as if maybe he was given a pass out.      Past Medical History:  Diagnosis Date  . Abnormal glucose   . Arthritis   . Cataract    surgically removed right eye   . ED (erectile dysfunction) of organic origin   . Glucose intolerance (malabsorption)   . Hyperlipidemia   . Hypertension   . Obesity   . PVC (premature ventricular contraction)   . Sleep apnea    reports used CPAP in the past, was loosing weight  . Umbilical hernia    ventral hernia, following GB surgery    Patient Active Problem List   Diagnosis Date Noted  . Osteoarthritis 07/29/2016  . BMI > 40 04/27/2016  . Gastroesophageal reflux disease 04/27/2016  . Erectile dysfunction 12/26/2015  . Oral lesion 12/26/2015  . Bilateral knee pain 08/22/2013  . Hyperlipemia 08/07/2010  . Obstructive sleep apnea 11/30/2008  . Essential hypertension 12/16/2006    Past Surgical History:  Procedure Laterality Date  . APPENDECTOMY    . mouth growth removal   03/16/2016  . TONSILLECTOMY         Home Medications    Prior to Admission medications   Medication Sig Start Date End Date Taking? Authorizing Provider  aspirin 81 MG tablet Take 81 mg by mouth daily.     Yes [provider]  furosemide  (LASIX) 20 MG tablet Take 20 mg by mouth daily.   Yes [provider]  ibuprofen (ADVIL,MOTRIN) 200 MG tablet Take 600 mg by mouth every 6 (six) hours as needed for moderate pain.   Yes [provider]  simvastatin (ZOCOR) 20 MG tablet Take 30 mg by mouth daily.   Yes [provider]  furosemide (LASIX) 20 MG tablet TAKE 1 TABLET BY MOUTH DAILY. Patient not taking: Reported on 03/17/2017 12/16/16   Lucretia Kern, DO  indomethacin (INDOCIN) 50 MG capsule Take 1 capsule (50 mg total) by mouth 3 (three) times daily with meals. Patient not taking: Reported on 03/17/2017 05/11/16   Forrest Moron, MD  simvastatin (ZOCOR) 20 MG tablet TAKE 1 AND 1/2 TABLETS BY MOUTH AT BEDTIME Patient not taking: Reported on 03/17/2017 08/06/16   Lucretia Kern, DO    Family History Family History  Problem Relation Age of Onset  . Bipolar disorder Mother   . Arthritis Mother   . Colon polyps Mother   . Hyperlipidemia Mother   . Hypertension Mother   . Colon polyps Father   . Hyperlipidemia Father   . Hypertension Father   . Intracerebral hemorrhage Unknown   . Colon cancer Neg Hx   . Esophageal cancer Neg Hx   . Rectal cancer Neg Hx   .  Stomach cancer Neg Hx     Social History Social History  Substance Use Topics  . Smoking status: Never Smoker  . Smokeless tobacco: Never Used  . Alcohol use 1.2 - 3.6 oz/week    2 - 6 Cans of beer per week     Allergies   Patient has no known allergies.   Review of Systems Review of Systems  Constitutional: Negative for fever.  HENT: Negative for congestion.   Eyes: Positive for photophobia and visual disturbance.  Respiratory: Negative for shortness of breath.   Cardiovascular: Positive for chest pain.  Gastrointestinal: Negative for abdominal pain.  Genitourinary: Negative for dysuria.  Musculoskeletal: Negative for back pain.  Skin: Negative for rash.  Neurological: Positive for dizziness, light-headedness and headaches.  Negative for seizures, syncope, facial asymmetry, speech difficulty and weakness.  Hematological: Does not bruise/bleed easily.  Psychiatric/Behavioral: Negative for confusion.     Physical Exam Updated Vital Signs BP 129/79 (BP Location: Right Arm)   Pulse 69   Temp 98.1 F (36.7 C)   Resp 13   Ht 1.727 m (5\' 8" )   Wt 129.3 kg (285 lb)   SpO2 97%   BMI 43.33 kg/m   Physical Exam  Constitutional: He is oriented to person, place, and time. He appears well-developed and well-nourished. No distress.  HENT:  Head: Normocephalic and atraumatic.  Mouth/Throat: Oropharynx is clear and moist.  Eyes: Pupils are equal, round, and reactive to light. EOM are normal.  Neck: Normal range of motion. Neck supple.  Cardiovascular: Normal rate and regular rhythm.   Pulmonary/Chest: Effort normal and breath sounds normal. No respiratory distress.  Abdominal: Soft. Bowel sounds are normal.  Neurological: He is alert and oriented to person, place, and time. No cranial nerve deficit or sensory deficit. He exhibits normal muscle tone. Coordination normal.  Skin: Skin is warm.  Nursing note and vitals reviewed.    ED Treatments / Results  Labs (all labs ordered are listed, but only abnormal results are displayed) Labs Reviewed  CBG MONITORING, ED - Abnormal; Notable for the following:       Result Value   Glucose-Capillary 192 (*)    All other components within normal limits  CBG MONITORING, ED - Abnormal; Notable for the following:    Glucose-Capillary 173 (*)    All other components within normal limits  I-STAT CHEM 8, ED - Abnormal; Notable for the following:    Glucose, Bld 169 (*)    Calcium, Ion 1.11 (*)    All other components within normal limits  CBC  URINALYSIS, ROUTINE W REFLEX MICROSCOPIC  ETHANOL  RAPID URINE DRUG SCREEN, HOSP PERFORMED  PROTIME-INR  APTT  DIFFERENTIAL  COMPREHENSIVE METABOLIC PANEL  I-STAT TROPONIN, ED    EKG  EKG  Interpretation  Date/Time:  Wednesday March 17 2017 13:27:55 EDT Ventricular Rate:  83 PR Interval:    QRS Duration: 110 QT Interval:  384 QTC Calculation: 452 R Axis:   -64 Text Interpretation:  Sinus rhythm Multiform ventricular premature complexes Left anterior fascicular block Abnormal R-wave progression, late transition Confirmed by Fredia Sorrow [phone]) on 03/17/2017 5:03:11 PM       Radiology Ct Head Wo Contrast  Result Date: 03/17/2017 CLINICAL DATA:  Blurred vision with diplopia and disorientation EXAM: CT HEAD WITHOUT CONTRAST TECHNIQUE: Contiguous axial images were obtained from the base of the skull through the vertex without intravenous contrast. COMPARISON:  None. FINDINGS: Brain: The ventricles are normal in size and configuration. There is no intracranial  mass, hemorrhage, extra-axial fluid collection, or midline shift. Gray-white compartments appear normal. No evident acute infarct. Vascular: No hyperdense vessel. No vascular calcification appreciable. Skull: Bony calvarium appears intact. Sinuses/Orbits: There is opacification of much of the left maxillary antrum with a large retention cyst present. There are small retention cysts in the right maxillary antrum. There is opacification of multiple ethmoid air cells bilaterally. There is mild mucosal thickening in the anterior right sphenoid sinus. There is a small retention cyst in the medial inferior left frontal sinus. There are no air-fluid levels in the paranasal sinuses. Orbits appear symmetric bilaterally except for evidence of apparent cataract removal on the right. Other: Mastoid air cells are clear. IMPRESSION: Multifocal paranasal sinus disease. No intracranial mass, hemorrhage, or extra-axial fluid collection. Gray-white compartments appear normal. Electronically Signed   By: Lowella Grip III M.D.   On: 03/17/2017 15:06    Procedures Procedures (including critical care time)  CRITICAL CARE Performed by:  Fredia Sorrow Total critical care time: 30 minutes Critical care time was exclusive of separately billable procedures and treating other patients. Critical care was necessary to treat or prevent imminent or life-threatening deterioration. Critical care was time spent personally by me on the following activities: development of treatment plan with patient and/or surrogate as well as nursing, discussions with consultants, evaluation of patient's response to treatment, examination of patient, obtaining history from patient or surrogate, ordering and performing treatments and interventions, ordering and review of laboratory studies, ordering and review of radiographic studies, pulse oximetry and re-evaluation of patient's condition.   Medications Ordered in ED Medications  0.9 %  sodium chloride infusion ( Intravenous New Bag/Given 03/17/17 1437)  acetaminophen (TYLENOL) tablet 650 mg (650 mg Oral Given 03/17/17 1450)     Initial Impression / Assessment and Plan / ED Course  I have reviewed the triage vital signs and the nursing notes.  Pertinent labs & imaging results that were available during my care of the patient were reviewed by me and considered in my medical decision making (see chart for details).     Patient's neurological symptoms are concerning for a possible stroke. Symptoms are improving drastically from the onset. Wear only has some slight blurred vision now. Other symptoms have resolved to include the dizziness the bright light in the eyes and the speech abnormality. Patient was called as a code stroke. But was not a candidate for TPA. Head CT without acute findings. MRI was ordered for further evaluation.  Patient also with a complaint of the chest soreness across the anterior part of chest he felt was working out yesterday. Chest pain is not concentrated a particular area. That was present prior to the symptoms.Do not feel that this is an acute cardiac event. Patient's initial  troponin was negative.  Disposition will be based mostly on MRI. And was symptoms reoccur. Patient will be turned over to the evening emergency physician for follow-up.  Final Clinical Impressions(s) / ED Diagnoses   Final diagnoses:  Blurred vision  Acute intractable headache, unspecified headache type    New Prescriptions New Prescriptions   No medications on file     Fredia Sorrow, MD 03/17/17 [phone]

## 2017-03-18 ENCOUNTER — Encounter: Payer: Self-pay | Admitting: Family Medicine

## 2017-03-18 ENCOUNTER — Ambulatory Visit (INDEPENDENT_AMBULATORY_CARE_PROVIDER_SITE_OTHER): Payer: 59 | Admitting: Family Medicine

## 2017-03-18 ENCOUNTER — Encounter: Payer: Self-pay | Admitting: Neurology

## 2017-03-18 VITALS — BP 118/90 | HR 72 | Temp 98.2°F | Ht 68.0 in | Wt 281.5 lb

## 2017-03-18 DIAGNOSIS — I10 Essential (primary) hypertension: Secondary | ICD-10-CM

## 2017-03-18 DIAGNOSIS — R519 Headache, unspecified: Secondary | ICD-10-CM

## 2017-03-18 DIAGNOSIS — R0789 Other chest pain: Secondary | ICD-10-CM

## 2017-03-18 DIAGNOSIS — R51 Headache: Secondary | ICD-10-CM

## 2017-03-18 DIAGNOSIS — F09 Unspecified mental disorder due to known physiological condition: Secondary | ICD-10-CM | POA: Diagnosis not present

## 2017-03-18 DIAGNOSIS — R739 Hyperglycemia, unspecified: Secondary | ICD-10-CM

## 2017-03-18 DIAGNOSIS — E785 Hyperlipidemia, unspecified: Secondary | ICD-10-CM | POA: Diagnosis not present

## 2017-03-18 DIAGNOSIS — R404 Transient alteration of awareness: Secondary | ICD-10-CM

## 2017-03-18 DIAGNOSIS — Z23 Encounter for immunization: Secondary | ICD-10-CM

## 2017-03-18 DIAGNOSIS — H539 Unspecified visual disturbance: Secondary | ICD-10-CM

## 2017-03-18 LAB — TSH: TSH: 3.49 u[IU]/mL (ref 0.35–4.50)

## 2017-03-18 LAB — HEMOGLOBIN A1C: HEMOGLOBIN A1C: 6 % (ref 4.6–6.5)

## 2017-03-18 NOTE — Progress Notes (Addendum)
HPI:  Travis Martinez a pleasant 56 year old with a past medical history significant for obesity, hypertension, hyperlipidemia and obstructive sleep apnea here for follow-up after going to the emergency room yesterday for a brief visual disturbance. Sounds like Per his report he had a brief visual disturbance accompanied by a altered mental state ( reports he felt very strange and felt like he did not know left from right, saw an inverted see in his line of vision and felt like he was altered), followed by a headache, along with some chest pain and was evaluated extensively in the emergency department with EKG, troponins, labs, CT scan and MRI. None of this was revealing and it was felt to perhaps be a migraine or symptoms related to mildly elevated blood sugar.  He reports he was very anxious about this and does want to see several specialists. Reports that he was told he should see a cardiologist by the emergency room provider. Reports he had some tension in his chest a few weeks ago at rest. He has had some soreness in his chest for the last few days because he has been lifting weights and working out. He doesn't have exertional chest pain, shortness of breath or orthopnea.He has a cardiologist he saw in the past for palpitations. He tried to make an appointment but reports that they require referral. He requests a referral today. He also has an appointment with his ophthalmologist in a few days. He would also like to see a neurologist. He reports for about 6 months he feels that he has had mild cognitive changes. He is doing quite well at work and even on retesting for his job, but feels that he is not as sharp as he used to be in the past. He does admit to increased stress. Reports in terms of his symptoms from yesterday, that he has done well today without any recurrence of these symptoms. Denies shortness of breath, headache, or vision changes today. He wants to get his flu and tetanus booster  vaccines today.   ROS: See pertinent positives and negatives per HPI.  Past Medical History:  Diagnosis Date  . Abnormal glucose   . Arthritis   . Cataract    surgically removed right eye   . ED (erectile dysfunction) of organic origin   . Glucose intolerance (malabsorption)   . Hyperlipidemia   . Hypertension   . Obesity   . PVC (premature ventricular contraction)   . Sleep apnea    reports used CPAP in the past, was loosing weight  . Umbilical hernia    ventral hernia, following GB surgery    Past Surgical History:  Procedure Laterality Date  . APPENDECTOMY    . mouth growth removal   03/16/2016  . TONSILLECTOMY      Family History  Problem Relation Age of Onset  . Bipolar disorder Mother   . Arthritis Mother   . Colon polyps Mother   . Hyperlipidemia Mother   . Hypertension Mother   . Colon polyps Father   . Hyperlipidemia Father   . Hypertension Father   . Intracerebral hemorrhage Unknown   . Colon cancer Neg Hx   . Esophageal cancer Neg Hx   . Rectal cancer Neg Hx   . Stomach cancer Neg Hx     Social History   Social History  . Marital status: Married    Spouse name: N/A  . Number of children: N/A  . Years of education: N/A   Social History  Main Topics  . Smoking status: Never Smoker  . Smokeless tobacco: Never Used  . Alcohol use 1.2 - 3.6 oz/week    2 - 6 Cans of beer per week  . Drug use: No  . Sexual activity: Not Asked   Other Topics Concern  . None   Social History Narrative  . None     Current Outpatient Prescriptions:  .  aspirin 81 MG tablet, Take 81 mg by mouth daily.  , Disp: , Rfl:  .  furosemide (LASIX) 20 MG tablet, TAKE 1 TABLET BY MOUTH DAILY., Disp: 90 tablet, Rfl: 1 .  furosemide (LASIX) 20 MG tablet, Take 20 mg by mouth daily., Disp: , Rfl:  .  simvastatin (ZOCOR) 20 MG tablet, TAKE 1 AND 1/2 TABLETS BY MOUTH AT BEDTIME, Disp: 135 tablet, Rfl: 1 .  simvastatin (ZOCOR) 20 MG tablet, Take 30 mg by mouth daily., Disp: ,  Rfl:   Current Facility-Administered Medications:  .  0.9 %  sodium chloride infusion, 500 mL, Intravenous, Continuous, Pyrtle, Lajuan Lines, MD  EXAM:  Vitals:   03/18/17 1015  BP: 118/90  Pulse: 72  Temp: 98.2 F (36.8 C)  SpO2: 97%    Body mass index is 42.8 kg/m.  GENERAL: vitals reviewed and listed above, alert, oriented, appears well hydrated and in no acute distress  HEENT: atraumatic, PERRLA, conjunttiva clear, no obvious abnormalities on inspection of external nose and ears  NECK: no obvious masses on inspection  LUNGS: clear to auscultation bilaterally, no wheezes, rales or rhonchi, good air movement  CV: HRRR, no peripheral edema  MS: moves all extremities without noticeable abnormality  PSYCH: pleasant and cooperative, no obvious depression or anxiety, speech and thought processing grossly intact, normal gait, normal functioning of extremities  ASSESSMENT AND PLAN:  Discussed the following assessment and plan:  Visual disturbance - Plan: Ambulatory referral to Cardiology, Ambulatory referral to Neurology  Acute nonintractable headache, unspecified headache type - Plan: Ambulatory referral to Neurology  Transient alteration of awareness - Plan: Hemoglobin A1c, TSH, Ambulatory referral to Cardiology, Ambulatory referral to Neurology  Cognitive dysfunction - Plan: Ambulatory referral to Neurology  Essential hypertension  Hyperlipidemia, unspecified hyperlipidemia type  Hyperglycemia  Atypical chest pain - Plan: Ambulatory referral to Cardiology  -we discussed possible serious and likely etiologies, workup and treatment, treatment risks and return precautions -after this discussion, Travis Martinez opted for checking a hemoglobin A1c and thyroid level today, referrals per his request, ophthalmology evaluation as scheduled -follow up advised here and in the emergency department as needed -Follow-up evaluations check out okay, did advise that we should consider anxiety  and stress in treatment for this -of course, we advised Travis Martinez  to return or notify a doctor immediately if symptoms worsen or persist or new concerns arise.   Patient Instructions  BEFORE YOU LEAVE: -vaccines - flu and tetanus booster  -labs -follow up: 3 months  -see the opthomologist as planned  -We placed the referrals for you as discussed. It usually takes about 1-2 weeks to process and schedule these referrals. If you have not heard from [place] or the specialty office regarding this appointment in 2 weeks please contact our office.  -I am glad you are feeling better. Please seek prompt care if symptoms recur.  We have ordered labs or studies at this visit. It can take up to 1-2 weeks for results and processing. IF results require follow up or explanation, we will call you with instructions. Clinically stable results will be released  to your [facility]. If you have not heard from [place] or cannot find your results in [facility] in 2 weeks please contact our office at [phone].  If you are not yet signed up for [facility], please consider signing up.  WE NOW OFFER   Travis Martinez's FAST TRACK!!!  SAME DAY Appointments for ACUTE CARE  Such as: Sprains, Injuries, cuts, abrasions, rashes, muscle pain, joint pain, back pain Colds, flu, sore throats, headache, allergies, cough, fever  Ear pain, sinus and eye infections Abdominal pain, nausea, vomiting, diarrhea, upset stomach Animal/insect bites  3 Easy Ways to Schedule: Walk-In Scheduling Call in scheduling Mychart Sign-up: [email or web address]                  Travis Benton R., DO

## 2017-03-18 NOTE — Patient Instructions (Signed)
BEFORE YOU LEAVE: -vaccines - flu and tetanus booster  -labs -follow up: 3 months  -see the opthomologist as planned  -We placed the referrals for you as discussed. It usually takes about 1-2 weeks to process and schedule these referrals. If you have not heard from [place] or the specialty office regarding this appointment in 2 weeks please contact our office.  -I am glad you are feeling better. Please seek prompt care if symptoms recur.  We have ordered labs or studies at this visit. It can take up to 1-2 weeks for results and processing. IF results require follow up or explanation, we will call you with instructions. Clinically stable results will be released to your [facility]. If you have not heard from [place] or cannot find your results in [facility] in 2 weeks please contact our office at [phone].  If you are not yet signed up for [facility], please consider signing up.  WE NOW OFFER   [facility] FAST TRACK!!!  SAME DAY Appointments for ACUTE CARE  Such as: Sprains, Injuries, cuts, abrasions, rashes, muscle pain, joint pain, back pain Colds, flu, sore throats, headache, allergies, cough, fever  Ear pain, sinus and eye infections Abdominal pain, nausea, vomiting, diarrhea, upset stomach Animal/insect bites  3 Easy Ways to Schedule: Walk-In Scheduling Call in scheduling Mychart Sign-up: [email or web address]

## 2017-03-18 NOTE — Addendum Note (Signed)
Addended by: Agnes Lawrence on: 03/18/2017 11:34 AM   Modules accepted: Orders

## 2017-03-19 ENCOUNTER — Encounter: Payer: Self-pay | Admitting: Family Medicine

## 2017-03-19 ENCOUNTER — Telehealth: Payer: Self-pay | Admitting: Family Medicine

## 2017-03-19 NOTE — Telephone Encounter (Signed)
Travis Martinez pt return your call

## 2017-03-22 ENCOUNTER — Encounter: Payer: Self-pay | Admitting: Family Medicine

## 2017-03-22 DIAGNOSIS — Z0100 Encounter for examination of eyes and vision without abnormal findings: Secondary | ICD-10-CM | POA: Diagnosis not present

## 2017-03-22 NOTE — Telephone Encounter (Signed)
See results note. 

## 2017-03-26 ENCOUNTER — Ambulatory Visit (INDEPENDENT_AMBULATORY_CARE_PROVIDER_SITE_OTHER): Payer: 59 | Admitting: Cardiovascular Disease

## 2017-03-26 ENCOUNTER — Encounter: Payer: Self-pay | Admitting: Cardiovascular Disease

## 2017-03-26 VITALS — BP 112/60 | HR 69 | Ht 68.0 in | Wt 278.4 lb

## 2017-03-26 DIAGNOSIS — R072 Precordial pain: Secondary | ICD-10-CM

## 2017-03-26 NOTE — Patient Instructions (Signed)
Medication Instructions:  Your physician recommends that you continue on your current medications as directed. Please refer to the Current Medication list given to you today.   Labwork: none  Testing/Procedures: Your physician has requested that you have an exercise tolerance test. For further information please visit [email or web address]. Please also follow instruction sheet, as given.  Your physician has requested that you have an echocardiogram. Echocardiography is a painless test that uses sound waves to create images of your heart. It provides your doctor with information about the size and shape of your heart and how well your heart's chambers and valves are working. This procedure takes approximately one hour. There are no restrictions for this procedure.    Follow-Up: Your physician wants you to follow-up in: 2 years.  You will receive a reminder letter in the mail two months in advance. If you don't receive a letter, please call our office to schedule the follow-up appointment.   Any Other Special Instructions Will Be Listed Below (If Applicable).     If you need a refill on your cardiac medications before your next appointment, please call your pharmacy.

## 2017-03-26 NOTE — Progress Notes (Signed)
Chief Complaint  Patient presents with  . New Patient (Initial Visit)   History of Present Illness: 56 yo male with history of HLD, HTN, PVCs and sleep apnea here today as a new patient to re-establish in our office. I have seen him remotely. Last visit in our office February 2012. I had seen him in 2012 for evaluation of chest pain and for further workup of PVCs noted on cardiac monitoring. Nuclear stress test in 2012 with no ischemia. Echo in 2012 with normal LV systolic function, no significant valve disease. Chest pain in the past improved with PPI. He was seen in the ED 03/17/17 with blurry vision. EKG showed sinus rhythm, PVCs.   He tells me today that he has been exercising every day. He has no chest pain with exertion. He has no dyspnea, diaphoresis when having very mild resting sharp chest pains. He has LE edema controlled with Lasix.    Primary Care Physician: Lucretia Kern, DO   Past Medical History:  Diagnosis Date  . Abnormal glucose   . Arthritis   . Cataract    surgically removed right eye   . ED (erectile dysfunction) of organic origin   . Glucose intolerance (malabsorption)   . Hyperlipidemia   . Hypertension   . Obesity   . PVC (premature ventricular contraction)   . Sleep apnea    reports used CPAP in the past, was loosing weight  . Umbilical hernia    ventral hernia, following GB surgery    Past Surgical History:  Procedure Laterality Date  . APPENDECTOMY    . CATARACT EXTRACTION    . HERNIA REPAIR    . mouth growth removal   03/16/2016  . TONSILLECTOMY      Current Outpatient Prescriptions  Medication Sig Dispense Refill  . aspirin 81 MG tablet Take 81 mg by mouth daily.      . furosemide (LASIX) 20 MG tablet TAKE 1 TABLET BY MOUTH DAILY. 90 tablet 1  . simvastatin (ZOCOR) 20 MG tablet TAKE 1 AND 1/2 TABLETS BY MOUTH AT BEDTIME 135 tablet 1   Current Facility-Administered Medications  Medication Dose Route Frequency Provider Last Rate Last Dose    . 0.9 %  sodium chloride infusion  500 mL Intravenous Continuous Pyrtle, Lajuan Lines, MD        No Known Allergies  Social History   Social History  . Marital status: Divorced    Spouse name: N/A  . Number of children: 2  . Years of education: N/A   Occupational History  . [employer]    Social History Main Topics  . Smoking status: Never Smoker  . Smokeless tobacco: Never Used  . Alcohol use 1.2 - 3.6 oz/week    2 - 6 Cans of beer per week  . Drug use: No  . Sexual activity: Not on file   Other Topics Concern  . Not on file   Social History Narrative  . No narrative on file    Family History  Problem Relation Age of Onset  . Bipolar disorder Mother   . Arthritis Mother   . Colon polyps Mother   . Hyperlipidemia Mother   . Hypertension Mother   . Colon polyps Father   . Hyperlipidemia Father   . Hypertension Father   . Intracerebral hemorrhage Unknown   . Heart attack Maternal Grandfather 50  . Colon cancer Neg Hx   . Esophageal cancer Neg Hx   . Rectal cancer Neg Hx   .  Stomach cancer Neg Hx     Review of Systems:  As stated in the HPI and otherwise negative.   BP 112/60   Pulse 69   Ht 5\' 8"  (1.727 m)   Wt 278 lb 6.4 oz (126.3 kg)   SpO2 95%   BMI 42.33 kg/m   Physical Examination: General: Well developed, well nourished, NAD  HEENT: OP clear, mucus membranes moist  SKIN: warm, dry. No rashes. Neuro: No focal deficits  Musculoskeletal: Muscle strength 5/5 all ext  Psychiatric: Mood and affect normal  Neck: No JVD, no carotid bruits, no thyromegaly, no lymphadenopathy.  Lungs:Clear bilaterally, no wheezes, rhonci, crackles Cardiovascular: Regular rate and rhythm. No murmurs, gallops or rubs. Abdomen:Soft. Bowel sounds present. Non-tender.  Extremities: No lower extremity edema. Pulses are 2 + in the bilateral DP/PT  EKG:  EKG is not ordered today. The ekg ordered today demonstrates   Recent Labs: 03/17/2017: BUN 17; Creatinine, Ser 0.90; Hemoglobin  16.3; Platelets 167; Potassium 3.6; Sodium 139 03/18/2017: TSH 3.49   Lipid Panel    Component Value Date/Time   CHOL 166 12/26/2015 0742   TRIG 270.0 (H) 12/26/2015 0742   HDL 33.30 (L) 12/26/2015 0742   CHOLHDL 5 12/26/2015 0742   VLDL 54.0 (H) 12/26/2015 0742   LDLCALC 113 (H) 04/13/2012 0807   LDLDIRECT 87.0 12/26/2015 0742     Wt Readings from Last 3 Encounters:  03/26/17 278 lb 6.4 oz (126.3 kg)  03/18/17 281 lb 8 oz (127.7 kg)  03/17/17 285 lb (129.3 kg)     Other studies Reviewed: Additional studies/ records that were reviewed today include:  Review of the above records demonstrates:   Assessment and Plan:   1. Chest pain: His chest pain is atypical. He is exercising with no problems. He does have risk factors for CAD including obesity, FH of CAD, HTN, HLD and borderline DM. I will arrange an exercise test to exclude ischemia. I will arrange an echo to assess LV function and exclude structural heart disease.   Current medicines are reviewed at length with the patient today.  The patient does not have concerns regarding medicines.  The following changes have been made:  no change  Labs/ tests ordered today include:   Orders Placed This Encounter  Procedures  . Exercise Tolerance Test  . ECHOCARDIOGRAM COMPLETE     Disposition:   FU with me in 24 months   Signed, Lauree Chandler, MD 03/26/2017 9:18 AM    [facility] [address], [place], [place]  [address] Phone: [phone]; Fax: [phone]

## 2017-04-02 ENCOUNTER — Ambulatory Visit (INDEPENDENT_AMBULATORY_CARE_PROVIDER_SITE_OTHER): Payer: 59

## 2017-04-02 ENCOUNTER — Other Ambulatory Visit: Payer: Self-pay

## 2017-04-02 ENCOUNTER — Ambulatory Visit (HOSPITAL_COMMUNITY): Payer: 59 | Attending: Cardiology

## 2017-04-02 DIAGNOSIS — R072 Precordial pain: Secondary | ICD-10-CM | POA: Insufficient documentation

## 2017-04-02 DIAGNOSIS — I10 Essential (primary) hypertension: Secondary | ICD-10-CM | POA: Insufficient documentation

## 2017-04-02 DIAGNOSIS — E785 Hyperlipidemia, unspecified: Secondary | ICD-10-CM | POA: Diagnosis not present

## 2017-04-02 DIAGNOSIS — E669 Obesity, unspecified: Secondary | ICD-10-CM | POA: Insufficient documentation

## 2017-04-02 DIAGNOSIS — G4730 Sleep apnea, unspecified: Secondary | ICD-10-CM | POA: Insufficient documentation

## 2017-04-02 DIAGNOSIS — I493 Ventricular premature depolarization: Secondary | ICD-10-CM | POA: Insufficient documentation

## 2017-04-06 LAB — EXERCISE TOLERANCE TEST
CHL CUP STRESS STAGE 1 DBP: 96 mmHg
CHL CUP STRESS STAGE 1 GRADE: 0 %
CHL CUP STRESS STAGE 1 SBP: 135 mmHg
CHL CUP STRESS STAGE 1 SPEED: 0 mph
CHL CUP STRESS STAGE 2 GRADE: 0 %
CHL CUP STRESS STAGE 2 HR: 74 {beats}/min
CHL CUP STRESS STAGE 3 HR: 78 {beats}/min
CHL CUP STRESS STAGE 3 SPEED: 1 mph
CHL CUP STRESS STAGE 4 HR: 80 {beats}/min
CHL CUP STRESS STAGE 4 SPEED: 1 mph
CHL CUP STRESS STAGE 5 DBP: 74 mmHg
CHL CUP STRESS STAGE 5 HR: 102 {beats}/min
CHL CUP STRESS STAGE 5 SBP: 172 mmHg
CHL CUP STRESS STAGE 5 SPEED: 1.7 mph
CHL CUP STRESS STAGE 6 DBP: 78 mmHg
CHL CUP STRESS STAGE 6 GRADE: 12 %
CHL CUP STRESS STAGE 6 HR: 126 {beats}/min
CHL CUP STRESS STAGE 7 DBP: 77 mmHg
CHL CUP STRESS STAGE 7 HR: 148 {beats}/min
CHL CUP STRESS STAGE 7 SPEED: 3.4 mph
CHL CUP STRESS STAGE 8 DBP: 75 mmHg
CHL CUP STRESS STAGE 9 DBP: 90 mmHg
CHL CUP STRESS STAGE 9 SBP: 155 mmHg
CHL CUP STRESS STAGE 9 SPEED: 0 mph
CSEPEW: 10.1 METS
CSEPPBP: 146 mmHg
CSEPPHR: 148 {beats}/min
Exercise duration (min): 9 min
Exercise duration (sec): 0 s
MPHR: 165 {beats}/min
Percent HR: 89 %
Percent of predicted max HR: 89 %
RPE: 15
Rest HR: 71 {beats}/min
Stage 1 HR: 74 {beats}/min
Stage 2 Speed: 0 mph
Stage 3 Grade: 0 %
Stage 4 Grade: 0 %
Stage 5 Grade: 10 %
Stage 6 SBP: 182 mmHg
Stage 6 Speed: 2.5 mph
Stage 7 Grade: 14 %
Stage 7 SBP: 146 mmHg
Stage 8 Grade: 0 %
Stage 8 HR: 129 {beats}/min
Stage 8 SBP: 179 mmHg
Stage 8 Speed: 0 mph
Stage 9 Grade: 0 %
Stage 9 HR: 92 {beats}/min

## 2017-04-07 ENCOUNTER — Other Ambulatory Visit: Payer: 59 | Admitting: *Deleted

## 2017-04-07 ENCOUNTER — Other Ambulatory Visit: Payer: Self-pay | Admitting: *Deleted

## 2017-04-07 DIAGNOSIS — I77810 Thoracic aortic ectasia: Secondary | ICD-10-CM

## 2017-04-07 DIAGNOSIS — I10 Essential (primary) hypertension: Secondary | ICD-10-CM

## 2017-04-07 LAB — BASIC METABOLIC PANEL
BUN / CREAT RATIO: 15 (ref 9–20)
BUN: 19 mg/dL (ref 6–24)
CO2: 22 mmol/L (ref 20–29)
CREATININE: 1.27 mg/dL (ref 0.76–1.27)
Calcium: 9.8 mg/dL (ref 8.7–10.2)
Chloride: 99 mmol/L (ref 96–106)
GFR, EST AFRICAN AMERICAN: 73 mL/min/{1.73_m2} (ref >59)
GFR, EST NON AFRICAN AMERICAN: 63 mL/min/{1.73_m2} (ref >59)
Glucose: 114 mg/dL — ABNORMAL HIGH (ref 65–99)
POTASSIUM: 4.8 mmol/L (ref 3.5–5.2)
Sodium: 139 mmol/L (ref 134–144)

## 2017-04-13 ENCOUNTER — Ambulatory Visit (INDEPENDENT_AMBULATORY_CARE_PROVIDER_SITE_OTHER)
Admission: RE | Admit: 2017-04-13 | Discharge: 2017-04-13 | Disposition: A | Discharge status: Home / Self Care | Payer: 59 | Source: Ambulatory Visit | Attending: Cardiovascular Disease | Admitting: Cardiovascular Disease

## 2017-04-13 DIAGNOSIS — I77810 Thoracic aortic ectasia: Secondary | ICD-10-CM

## 2017-04-13 DIAGNOSIS — R079 Chest pain, unspecified: Secondary | ICD-10-CM | POA: Diagnosis not present

## 2017-04-13 MED ORDER — IOPAMIDOL (ISOVUE-370) INJECTION 76%
100.0000 mL | Freq: Once | INTRAVENOUS | Status: AC | PRN
  Administered 2017-04-13: 100 mL via INTRAVENOUS

## 2017-04-15 ENCOUNTER — Encounter: Payer: Self-pay | Admitting: Family Medicine

## 2017-04-21 ENCOUNTER — Ambulatory Visit (INDEPENDENT_AMBULATORY_CARE_PROVIDER_SITE_OTHER): Payer: 59 | Admitting: Pulmonary Disease

## 2017-04-21 ENCOUNTER — Encounter: Payer: Self-pay | Admitting: Pulmonary Disease

## 2017-04-21 VITALS — BP 130/86 | HR 68 | Ht 68.0 in | Wt 281.0 lb

## 2017-04-21 DIAGNOSIS — G4733 Obstructive sleep apnea (adult) (pediatric): Secondary | ICD-10-CM

## 2017-04-21 DIAGNOSIS — Z6841 Body Mass Index (BMI) 40.0 and over, adult: Secondary | ICD-10-CM

## 2017-04-21 NOTE — Patient Instructions (Signed)
Follow up in 1 year.

## 2017-04-21 NOTE — Progress Notes (Signed)
Current Outpatient Prescriptions on File Prior to Visit  Medication Sig  . aspirin 81 MG tablet Take 81 mg by mouth daily.    . furosemide (LASIX) 20 MG tablet TAKE 1 TABLET BY MOUTH DAILY.  . simvastatin (ZOCOR) 20 MG tablet TAKE 1 AND 1/2 TABLETS BY MOUTH AT BEDTIME   Current Facility-Administered Medications on File Prior to Visit  Medication  . 0.9 %  sodium chloride infusion    Chief Complaint  Patient presents with  . Follow-up    Pt doing well overall, [facility].     Sleep tests HST 02/10/15 >> AHI 56.9, SaO2 low 69%. Auto CPAP 01/20/17 to 04/19/17 >> used on 63 of 90 nights with average 8 hrs 40 min.  Average AHI 1.4 with median CPAP 8 and 95 th percentile CPAP 11 cm H2O  Cardiac tests Echo 04/02/17 >> EF 60 to 65%, aortic root 41 mm, mod LVH  Past medical history HTN, HLD, ED  Past surgical history, Family history, Social history, Allergies reviewed  Vital signs BP 130/86 (BP Location: Left Arm, Cuff Size: Normal)   Pulse 68   Ht 5\' 8"  (1.727 m)   Wt 281 lb (127.5 kg)   SpO2 96%   BMI 42.73 kg/m   History of Present Illness: Travis Martinez is a 56 y.o. male with OSA.  He is doing well with CPAP.  Only issue with mask is that strap wears out before he can get new one.  He has second machine [place]   Physical Exam:  General - pleasant Eyes - pupils reactive ENT - no sinus tenderness, no oral exudate, no LAN Cardiac - regular, no murmur Chest - no wheeze, rales Abd - soft, non tender Ext - no edema Skin - no rashes Neuro - normal strength Psych - normal mood   Assessment/Plan:  Obstructive sleep apnea. - he is compliant with CPAP and reports benefit - advised him to look up alternative mask options and call if he finds one he would like to switch to - continue auto CPAP  Morbid obesity. - discussed options to assist with weight loss   Patient Instructions  Follow up in 1 year   Chesley Mires, MD [facility]  [facility]/Sleep Pager:  [phone] 04/21/2017, 9:22 AM

## 2017-04-30 ENCOUNTER — Other Ambulatory Visit: Payer: Self-pay | Admitting: Family Medicine

## 2017-04-30 MED FILL — SIMVASTATIN 20 MG TABLET: 20 | 90 days supply | Qty: 135 | Fill #0

## 2017-04-30 MED FILL — FUROSEMIDE 20 MG TABLET: 20 | 90 days supply | Qty: 90 | Fill #1

## 2017-05-03 ENCOUNTER — Encounter: Payer: Self-pay | Admitting: Family Medicine

## 2017-05-04 ENCOUNTER — Encounter: Payer: Self-pay | Admitting: Cardiovascular Disease

## 2017-05-13 DIAGNOSIS — G4733 Obstructive sleep apnea (adult) (pediatric): Secondary | ICD-10-CM | POA: Diagnosis not present

## 2017-05-17 ENCOUNTER — Encounter: Payer: Self-pay | Admitting: Neurology

## 2017-05-17 ENCOUNTER — Other Ambulatory Visit: Payer: 59

## 2017-05-17 ENCOUNTER — Ambulatory Visit (INDEPENDENT_AMBULATORY_CARE_PROVIDER_SITE_OTHER): Payer: 59 | Admitting: Neurology

## 2017-05-17 VITALS — BP 148/92 | HR 81 | Ht 68.0 in | Wt 285.0 lb

## 2017-05-17 DIAGNOSIS — H539 Unspecified visual disturbance: Secondary | ICD-10-CM

## 2017-05-17 DIAGNOSIS — G252 Other specified forms of tremor: Secondary | ICD-10-CM

## 2017-05-17 DIAGNOSIS — R410 Disorientation, unspecified: Secondary | ICD-10-CM

## 2017-05-17 DIAGNOSIS — R413 Other amnesia: Secondary | ICD-10-CM | POA: Diagnosis not present

## 2017-05-17 DIAGNOSIS — R259 Unspecified abnormal involuntary movements: Secondary | ICD-10-CM

## 2017-05-17 NOTE — Progress Notes (Signed)
NEUROLOGY CONSULTATION NOTE  Travis Martinez MRN: [id number] DOB: 05/05/1961  Referring provider: Dr. Colin Benton Primary care provider: Dr. Colin Benton  Reason for consult:  Headache, visual disturbance, memory  Dear Dr Maudie Mercury:  Thank you for your kind referral of Travis Martinez for consultation of the above symptoms. Although his history is well known to you, please allow me to reiterate it for the purpose of our medical record. Records and images were personally reviewed where available.  HISTORY OF PRESENT ILLNESS: This is a very pleasant 56 year old right-handed man with a history of hypertension, hyperlipidemia, prediabetes, presenting for evaluation of cognitive changes and recent vision disturbance. He started noticing diminished mental capacity around 5 years ago. He works a lead position in Engineer, technical sales at [employer] and states he is still able to do complex processing but he notices his peers get things faster than he does. He used to be able to process a lot better in the past. He would be looking at 12 things on the screen and could not remember one item. He has been writing more notes to help him. He feels this is affecting his work, he is thinking of stepping down from lead position due to this. He denies getting lost driving, no missed bills (all are on autopay). He is pretty good with remembering medications except on weekends when he is off his routine. His mother had dementia in her 55s. He had an accident 6-7 years ago fracturing ribs, but does not recall any head injuries. He occasionally drinks heavily on weekends but denies drinking alcohol on a daily basis.   He reports a transient episode of visual change that happened last 03/17/17. He was working at his desk when vision became blurred and he started seeing double and triple. There were haloes on both side of his vision, like inverted Cs on each side. He felt he could not see, "like walking from a dark room into a bright light." He looked up  his symptoms and felt that maybe he started to panic, he felt confused and not himself. He could talk but did not feel like talking, he was grumpy and did not want to hear what other people were telling him. Symptoms started clearing up in the ER, around 30-45 minutes later. In the ER, he started having a really bad frontal headache with some light sensitivity. No nausea/vomiting. He had an MRI brain without contrast which I personally reviewed, no acute changes, there was mild chronic microvascular disease. He saw his eye doctor who told him he had a retinal migraine. He had a milder episode a couple of weeks later with slight vision changes and mild headache. No similar symptoms in almost 2 months. He denies any prior history of migraines or headaches. His mother had migraines. He had slight chest pain that day and has seen Cardiology since then, with normal stress test.  He has had a right hand tremor for the past 5+ years, worse when he is tired/fatigued. It does not affect typing or eating. His mother had essential tremor. He has occasional numbness in both hands. He has occasional back pain. He denies any dizziness, dysarthria/dysphagia, neck pain, bowel/bladder dysfunction, anosmia, no falls. He denies any staring/unresponsive episodes, gaps in time, olfactory/gustatory hallucinations, myoclonic jerks. He had a normal birth and early development, no history of febrile seizures, CNS infections, significant head injuries, or family history of seizures.  Laboratory Data: Lab Results  Component Value Date   TSH 3.49  03/18/2017     PAST MEDICAL HISTORY: Past Medical History:  Diagnosis Date  . Abnormal glucose   . Arthritis   . Cataract    surgically removed right eye   . ED (erectile dysfunction) of organic origin   . Glucose intolerance (malabsorption)   . Hyperlipidemia   . Hypertension   . Obesity   . PVC (premature ventricular contraction)   . Sleep apnea    reports used CPAP in the  past, was loosing weight  . Umbilical hernia    ventral hernia, following GB surgery    PAST SURGICAL HISTORY: Past Surgical History:  Procedure Laterality Date  . APPENDECTOMY    . CATARACT EXTRACTION    . HERNIA REPAIR    . mouth growth removal   03/16/2016  . TONSILLECTOMY      MEDICATIONS: Current Outpatient Prescriptions on File Prior to Visit  Medication Sig Dispense Refill  . aspirin 81 MG tablet Take 81 mg by mouth daily.      . furosemide (LASIX) 20 MG tablet TAKE 1 TABLET BY MOUTH DAILY. 90 tablet 1  . simvastatin (ZOCOR) 20 MG tablet TAKE 1 AND 1/2 TABLETS BY MOUTH AT BEDTIME 135 tablet 1   Current Facility-Administered Medications on File Prior to Visit  Medication Dose Route Frequency Provider Last Rate Last Dose  . 0.9 %  sodium chloride infusion  500 mL Intravenous Continuous Pyrtle, Lajuan Lines, MD        ALLERGIES: No Known Allergies  FAMILY HISTORY: Family History  Problem Relation Age of Onset  . Bipolar disorder Mother   . Arthritis Mother   . Colon polyps Mother   . Hyperlipidemia Mother   . Hypertension Mother   . Colon polyps Father   . Hyperlipidemia Father   . Hypertension Father   . Intracerebral hemorrhage Unknown   . Heart attack Maternal Grandfather 50  . Colon cancer Neg Hx   . Esophageal cancer Neg Hx   . Rectal cancer Neg Hx   . Stomach cancer Neg Hx     SOCIAL HISTORY: Social History   Social History  . Marital status: Divorced    Spouse name: N/A  . Number of children: 2  . Years of education: N/A   Occupational History  . [employer]    Social History Main Topics  . Smoking status: Never Smoker  . Smokeless tobacco: Never Used  . Alcohol use 1.2 - 3.6 oz/week    2 - 6 Cans of beer per week  . Drug use: No  . Sexual activity: Not on file   Other Topics Concern  . Not on file   Social History Narrative  . No narrative on file    REVIEW OF SYSTEMS: Constitutional: No fevers, chills, or sweats, no generalized fatigue,  change in appetite Eyes: No visual changes, double vision, eye pain Ear, nose and throat: No hearing loss, ear pain, nasal congestion, sore throat Cardiovascular: No chest pain, palpitations Respiratory:  No shortness of breath at rest or with exertion, wheezes GastrointestinaI: No nausea, vomiting, diarrhea, abdominal pain, fecal incontinence Genitourinary:  No dysuria, urinary retention or frequency Musculoskeletal:  No neck pain, +back pain Integumentary: No rash, pruritus, skin lesions Neurological: as above Psychiatric: No depression, insomnia, anxiety Endocrine: No palpitations, fatigue, diaphoresis, mood swings, change in appetite, change in weight, increased thirst Hematologic/Lymphatic:  No anemia, purpura, petechiae. Allergic/Immunologic: no itchy/runny eyes, nasal congestion, recent allergic reactions, rashes  PHYSICAL EXAM: Vitals:   05/17/17 0852  BP: Marland Kitchen)  148/92  Pulse: 81  SpO2: 94%   General: No acute distress Head:  Normocephalic/atraumatic Eyes: Fundoscopic exam shows bilateral sharp discs, no vessel changes, exudates, or hemorrhages Neck: supple, no paraspinal tenderness, full range of motion Back: No paraspinal tenderness Heart: regular rate and rhythm Lungs: Clear to auscultation bilaterally. Vascular: No carotid bruits. Skin/Extremities: No rash, no edema Neurological Exam: Mental status: alert and oriented to person, place, and time, no dysarthria or aphasia, Fund of knowledge is appropriate.  Recent and remote memory are intact.  Attention and concentration are normal.    Able to name objects and repeat phrases.  Montreal Cognitive Assessment  05/17/2017  Visuospatial/ Executive (0/5) 5  Naming (0/3) 3  Attention: Read list of digits (0/2) 2  Attention: Read list of letters (0/1) 1  Attention: Serial 7 subtraction starting at 100 (0/3) 3  Language: Repeat phrase (0/2) 2  Language : Fluency (0/1) 1  Abstraction (0/2) 2  Delayed Recall (0/5) 5    Orientation (0/6) 6  Total 30   Cranial nerves: CN I: not tested CN II: pupils equal, round and reactive to light, visual fields intact, fundi unremarkable. CN III, IV, VI:  full range of motion, no nystagmus, no ptosis CN V: facial sensation intact CN VII: upper and lower face symmetric CN VIII: hearing intact to finger rub CN IX, X: gag intact, uvula midline CN XI: sternocleidomastoid and trapezius muscles intact CN XII: tongue midline Bulk & Tone: normal, no clear cogwheeling but tone slightly increased on right wrist, no fasciculations. Motor: 5/5 throughout with no pronator drift. Decreased finger taps on the right hand. Good foot taps Sensation: intact to light touch, cold, pin, vibration and joint position sense.  No extinction to double simultaneous stimulation.  Romberg test negative Deep Tendon Reflexes: +2 throughout, no ankle clonus Plantar responses: downgoing bilaterally Cerebellar: no incoordination on finger to nose testing Gait: narrow-based and steady with right thumb tremor on ambulation, fair arm swing, able to tandem walk adequately. Tremor: +right hand and thumb resting tremor, no postural or action tremor. Negative pull test.   IMPRESSION: This is a very pleasant 56 year old right-handed man with a history of hypertension, hyperlipidemia, prediabetes, presenting for evaluation of a transient episode of vision changes and confusion followed by frontal headache last 03/17/17. He has also been noticing cognitive changes for the past 5 years and thinking of stepping down from lead position due to this. His neurological exam is non-focal, there is note of a resting tremor on the right hand, but no other signs of Parkinsonism. MOCA score normal 30/30. Etiology of symptoms is unclear. With regards to the transient episode of vision changes and confusion followed by headache, confusion migraine with aura is a consideration, however atypical to occur at this age with no prior  history of migraine. MRI brain normal. Check ESR and CRP, routine EEG, and carotid dopplers. We discussed memory changes, check B12. He will be scheduled for Neurocognitive testing to further evaluate memory complaints. Continue control of vascular risk factors and daily aspirin. He will follow-up after the tests and knows to call for any changes.   Thank you for allowing me to participate in the care of this patient. Please do not hesitate to call for any questions or concerns.   Ellouise Newer, M.D.  CC: Dr. Maudie Mercury

## 2017-05-17 NOTE — Patient Instructions (Addendum)
1. Bloodwork for B12, ESR, CRP  Your provider has requested that you have labwork completed today. Please go to [facility] ([id number]) on the second floor of this building before leaving the office today. You do not need to check in. If you are not called within 15 minutes please check with the front desk.   2. Schedule carotid ultrasound  We have sent a referral to [facility] for your ULTRASOUND  and they will call you directly to schedule your appt. They are located at [place]. If you need to contact them directly please call [phone].   3. Schedule routine EEG 4. Schedule Neurocognitive testing with Dr. Si Raider 5. Follow-up after tests  You have been referred for a neurocognitive evaluation in our office.   The evaluation consists of three appointments.   1. The first appointment is about 45 minutes and is a clinical interview with the neuropsychologist (Dr. Macarthur Critchley). You can bring someone with you to this appointment, as it is helpful for Dr. Si Raider to hear from both you and another adult who knows you well.   2. The second appointment is 2-3 hours long and is with the psychometrician Milana Kidney). You will complete a variety of tasks- mostly question-and-answer, some paper-and-pencil, some on the computer. There is nothing you need to do to prepare for this appointment, but having a good night's sleep prior to the testing, and bringing eyeglasses and hearing aids (if you wear them), is advised.   3. The final appointment is a follow-up with Dr. Si Raider where she will go over the test results with you and provide recommendations. This appointment is about 30 minutes.  If you would like a family member to receive this information as well, please bring them to the appointment.   We have to reserve several hours of the neuropsychologist's time and the psychometrician's time for your appointment. As such, please note that there is a No-Show fee of  $100. If you are unable to attend any of your appointments, please contact our office as soon as possible to reschedule.

## 2017-05-18 LAB — VITAMIN B12: Vitamin B-12: 286 pg/mL (ref 200–1100)

## 2017-05-18 LAB — C-REACTIVE PROTEIN: CRP: 1.4 mg/L (ref <8.0)

## 2017-05-18 LAB — SEDIMENTATION RATE: SED RATE: 2 mm/h (ref 0–20)

## 2017-05-21 ENCOUNTER — Other Ambulatory Visit: Payer: Self-pay

## 2017-05-21 ENCOUNTER — Telehealth: Payer: Self-pay

## 2017-05-21 NOTE — Telephone Encounter (Signed)
-----   Message from Cameron Sprang, MD sent at 05/18/2017 11:56 AM EDT ----- Pls let him know the B12 level was low normal, it is 286. When people have neurological symptoms, we usually want levels to be above 400. Start daily B12 500 mcg supplement. Thanks

## 2017-05-21 NOTE — Telephone Encounter (Signed)
LMOM asking pt to return call to the office to relay message below

## 2017-05-21 NOTE — Telephone Encounter (Signed)
Pt returned call.  Relayed message below.  Pt states that he saw on MyChart that his B12 levels were slightly low and has already started b12 supplements.

## 2017-05-24 ENCOUNTER — Ambulatory Visit (INDEPENDENT_AMBULATORY_CARE_PROVIDER_SITE_OTHER): Payer: 59 | Admitting: Neurology

## 2017-05-24 DIAGNOSIS — R410 Disorientation, unspecified: Secondary | ICD-10-CM

## 2017-05-24 DIAGNOSIS — H539 Unspecified visual disturbance: Secondary | ICD-10-CM | POA: Diagnosis not present

## 2017-05-24 DIAGNOSIS — R413 Other amnesia: Secondary | ICD-10-CM

## 2017-05-24 DIAGNOSIS — R259 Unspecified abnormal involuntary movements: Secondary | ICD-10-CM

## 2017-05-24 DIAGNOSIS — G252 Other specified forms of tremor: Secondary | ICD-10-CM

## 2017-05-25 NOTE — Procedures (Signed)
ELECTROENCEPHALOGRAM REPORT  Date of Study: 05/24/2017  Patient's Name: Travis Martinez MRN: [id number] Date of Birth: [birth year]  Referring Provider: Dr. Ellouise Newer  Clinical History: This is a 56 year old man with a transient episode of vision changes and confusion followed by headache. He has been having cognitive changes for several years.  Medications: Aspirin, Lasix, Zocor  Technical Summary: A multichannel digital EEG recording measured by the international 10-20 system with electrodes applied with paste and impedances below 5000 ohms performed in our laboratory with EKG monitoring in an awake and asleep patient.  Hyperventilation and photic stimulation were performed.  The digital EEG was referentially recorded, reformatted, and digitally filtered in a variety of bipolar and referential montages for optimal display.    Description: The patient is awake and asleep during the recording.  During maximal wakefulness, there is a symmetric, medium voltage 10-10.5 Hz posterior dominant rhythm that attenuates with eye opening.  The record is symmetric.  During drowsiness and sleep, there is an increase in theta slowing of the background.  Vertex waves and symmetric sleep spindles were seen.  Hyperventilation and photic stimulation did not elicit any abnormalities.  There were no epileptiform discharges or electrographic seizures seen.    EKG lead was unremarkable.  Impression: This awake and asleep EEG is normal.    Clinical Correlation: A normal EEG does not exclude a clinical diagnosis of epilepsy.  If further clinical questions remain, prolonged EEG may be helpful.  Clinical correlation is advised.   Ellouise Newer, M.D.

## 2017-05-26 ENCOUNTER — Telehealth: Payer: Self-pay

## 2017-05-26 NOTE — Telephone Encounter (Signed)
Message below relayed via MyChart.  

## 2017-05-26 NOTE — Telephone Encounter (Signed)
-----   Message from Cameron Sprang, MD sent at 05/25/2017  3:58 PM EDT ----- Pls let him know the brain wave test was normal, thanks

## 2017-05-28 ENCOUNTER — Ambulatory Visit: Payer: 59 | Admitting: Cardiovascular Disease

## 2017-06-10 ENCOUNTER — Ambulatory Visit: Payer: 59 | Admitting: Family Medicine

## 2017-06-30 NOTE — Progress Notes (Signed)
HPI:  Travis Martinez is a pleasant 56 yo with a PMH obesity, HTN, hyperglycemia, HLD, sleep apnea, fatty liver and b12 def here for follow up. Had ext eval for spell of transient visual disturbance with cardiology and neurologist. Sees pulmonologist for OSA.  He is happy to report he has made significant progress with exercise and diet the last 2 months.  Reports he is feeling much better and has lost 20 pounds.  He wants to recheck his cholesterol and diabetes labs.  We reviewed his diet and he is eating healthier, he is eating a pretty sugary breakfast however with instant oatmeal and fruit sweetened yogurt.  Talked about ways to cut out processed foods, added sugar, sugary fruits and simple starches.  Advised instead that he eat more vegetables and small handfuls of nuts for snacks, whole grains if starches and ensure getting enough protein.  Feels like his mood and cognitive function have improved.  No CP or SOB.   ROS: See pertinent positives and negatives per HPI.  Past Medical History:  Diagnosis Date  . Abnormal glucose   . Arthritis   . Cataract    surgically removed right eye   . ED (erectile dysfunction) of organic origin   . Glucose intolerance (malabsorption)   . Hyperlipidemia   . Hypertension   . Obesity   . PVC (premature ventricular contraction)   . Sleep apnea    reports used CPAP in the past, was loosing weight  . Umbilical hernia    ventral hernia, following GB surgery    Past Surgical History:  Procedure Laterality Date  . APPENDECTOMY    . CATARACT EXTRACTION    . HERNIA REPAIR    . mouth growth removal   03/16/2016  . TONSILLECTOMY      Family History  Problem Relation Age of Onset  . Bipolar disorder Mother   . Arthritis Mother   . Colon polyps Mother   . Hyperlipidemia Mother   . Hypertension Mother   . Colon polyps Father   . Hyperlipidemia Father   . Hypertension Father   . Intracerebral hemorrhage Unknown   . Heart attack Maternal  Grandfather 50  . Colon cancer Neg Hx   . Esophageal cancer Neg Hx   . Rectal cancer Neg Hx   . Stomach cancer Neg Hx     Social History   Socioeconomic History  . Marital status: Divorced    Spouse name: None  . Number of children: 2  . Years of education: None  . Highest education level: None  Social Needs  . Financial resource strain: None  . Food insecurity - worry: None  . Food insecurity - inability: None  . Transportation needs - medical: None  . Transportation needs - non-medical: None  Occupational History  . Occupation: [employer]  Tobacco Use  . Smoking status: Never Smoker  . Smokeless tobacco: Never Used  Substance and Sexual Activity  . Alcohol use: Yes    Alcohol/week: 1.2 - 3.6 oz    Types: 2 - 6 Cans of beer per week  . Drug use: No  . Sexual activity: None  Other Topics Concern  . None  Social History Narrative   Lives in 2 story home with his significant other   Has 2 adult children   Highest level of education: bachelor's degree with some post grad   Works in Engineer, technical sales for [employer]      Current Outpatient Medications:  .  aspirin 81  MG tablet, Take 81 mg by mouth daily.  , Disp: , Rfl:  .  cyanocobalamin 500 MCG tablet, Take 500 mcg by mouth daily., Disp: , Rfl:  .  furosemide (LASIX) 20 MG tablet, TAKE 1 TABLET BY MOUTH DAILY., Disp: 90 tablet, Rfl: 1 .  simvastatin (ZOCOR) 20 MG tablet, TAKE 1 AND 1/2 TABLETS BY MOUTH AT BEDTIME, Disp: 135 tablet, Rfl: 1  Current Facility-Administered Medications:  .  0.9 %  sodium chloride infusion, 500 mL, Intravenous, Continuous, Pyrtle, Lajuan Lines, MD  EXAM:  Vitals:   07/01/17 0807  BP: 102/80  Pulse: 94  Temp: 98.5 F (36.9 C)    Body mass index is 39.52 kg/m.  GENERAL: vitals reviewed and listed above, alert, oriented, appears well hydrated and in no acute distress  HEENT: atraumatic, conjunttiva clear, no obvious abnormalities on inspection of external nose and ears  NECK: no obvious masses on  inspection  LUNGS: clear to auscultation bilaterally, no wheezes, rales or rhonchi, good air movement  CV: HRRR, no peripheral edema  MS: moves all extremities without noticeable abnormality  PSYCH: pleasant and cooperative, no obvious depression or anxiety  ASSESSMENT AND PLAN:  Discussed the following assessment and plan:  Essential hypertension  Hyperlipidemia, unspecified hyperlipidemia type - Plan: Lipid panel  Hyperglycemia - Plan: Hemoglobin A1c  Morbid obesity ([place])  Fatty liver  -Congratulated on lifestyle changes and weight reduction, discussed changes at length and advised a healthy low sugar Mediterranean-style diet -Advised regular exercise -Continue B12 -Labs today per his wishes -Follow-up in 3 months   Patient Instructions  BEFORE YOU LEAVE: -Labs -follow up: Follow-up in 3-4 months  We have ordered labs or studies at this visit. It can take up to 1-2 weeks for results and processing. IF results require follow up or explanation, we will call you with instructions. Clinically stable results will be released to your [facility]. If you have not heard from [place] or cannot find your results in [facility] in 2 weeks please contact our office at [phone].  If you are not yet signed up for [facility], please consider signing up.   We recommend the following healthy lifestyle for LIFE: 1) Small portions. But, make sure to get regular (at least 3 per day), healthy meals and small healthy snacks if needed.  2) Eat a healthy clean diet.   TRY TO EAT: -at least 5-7 servings of low sugar, colorful, and nutrient rich vegetables per day (not corn, potatoes or bananas.) -berries are the best choice if you wish to eat fruit (only eat small amounts if trying to reduce weight)  -lean meets (fish, white meat of chicken or [place]) -vegan proteins for some meals - beans or tofu, whole grains, nuts and seeds -Replace bad fats with good fats - good fats include: fish, nuts and seeds,  canola oil, olive oil -small amounts of low fat or non fat dairy -small amounts of100 % whole grains - check the lables -drink plenty of water  AVOID: -SUGAR, sweets, anything with added sugar, corn syrup or sweeteners - must read labels as even foods advertised as "healthy" often are loaded with sugar -if you must have a sweetener, small amounts of stevia may be best -sweetened beverages and artificially sweetened beverages -simple starches (rice, bread, potatoes, pasta, chips, etc - small amounts of 100% whole grains are ok) -red meat, pork, butter -fried foods, fast food, processed food, excessive dairy, eggs and coconut.  3)Get at least 150 minutes of sweaty aerobic exercise per  week.  4)Reduce stress - consider counseling, meditation and relaxation to balance other aspects of your life.       [place]., DO

## 2017-07-01 ENCOUNTER — Ambulatory Visit: Payer: 59 | Admitting: Family Medicine

## 2017-07-01 ENCOUNTER — Encounter: Payer: Self-pay | Admitting: Family Medicine

## 2017-07-01 ENCOUNTER — Ambulatory Visit (INDEPENDENT_AMBULATORY_CARE_PROVIDER_SITE_OTHER): Payer: 59 | Admitting: Family Medicine

## 2017-07-01 VITALS — BP 102/80 | HR 94 | Temp 98.5°F | Ht 68.0 in | Wt 259.9 lb

## 2017-07-01 DIAGNOSIS — R739 Hyperglycemia, unspecified: Secondary | ICD-10-CM | POA: Diagnosis not present

## 2017-07-01 DIAGNOSIS — K760 Fatty (change of) liver, not elsewhere classified: Secondary | ICD-10-CM | POA: Diagnosis not present

## 2017-07-01 DIAGNOSIS — E785 Hyperlipidemia, unspecified: Secondary | ICD-10-CM | POA: Diagnosis not present

## 2017-07-01 DIAGNOSIS — I10 Essential (primary) hypertension: Secondary | ICD-10-CM | POA: Diagnosis not present

## 2017-07-01 LAB — HEMOGLOBIN A1C: Hgb A1c MFr Bld: 5.7 % (ref 4.6–6.5)

## 2017-07-01 LAB — LIPID PANEL
Cholesterol: 155 mg/dL (ref 0–200)
HDL: 39.2 mg/dL (ref >39.00)
LDL CALC: 79 mg/dL (ref 0–99)
NONHDL: 116.25
Total CHOL/HDL Ratio: 4
Triglycerides: 185 mg/dL — ABNORMAL HIGH (ref 0.0–149.0)
VLDL: 37 mg/dL (ref 0.0–40.0)

## 2017-07-01 NOTE — Patient Instructions (Addendum)
BEFORE YOU LEAVE: -Labs -follow up: Follow-up in 3-4 months  We have ordered labs or studies at this visit. It can take up to 1-2 weeks for results and processing. IF results require follow up or explanation, we will call you with instructions. Clinically stable results will be released to your [facility]. If you have not heard from [place] or cannot find your results in [facility] in 2 weeks please contact our office at [phone].  If you are not yet signed up for [facility], please consider signing up.   We recommend the following healthy lifestyle for LIFE: 1) Small portions. But, make sure to get regular (at least 3 per day), healthy meals and small healthy snacks if needed.  2) Eat a healthy clean diet.   TRY TO EAT: -at least 5-7 servings of low sugar, colorful, and nutrient rich vegetables per day (not corn, potatoes or bananas.) -berries are the best choice if you wish to eat fruit (only eat small amounts if trying to reduce weight)  -lean meets (fish, white meat of chicken or [place]) -vegan proteins for some meals - beans or tofu, whole grains, nuts and seeds -Replace bad fats with good fats - good fats include: fish, nuts and seeds, canola oil, olive oil -small amounts of low fat or non fat dairy -small amounts of100 % whole grains - check the lables -drink plenty of water  AVOID: -SUGAR, sweets, anything with added sugar, corn syrup or sweeteners - must read labels as even foods advertised as "healthy" often are loaded with sugar -if you must have a sweetener, small amounts of stevia may be best -sweetened beverages and artificially sweetened beverages -simple starches (rice, bread, potatoes, pasta, chips, etc - small amounts of 100% whole grains are ok) -red meat, pork, butter -fried foods, fast food, processed food, excessive dairy, eggs and coconut.  3)Get at least 150 minutes of sweaty aerobic exercise per week.  4)Reduce stress - consider counseling, meditation and relaxation to  balance other aspects of your life.       [id number]

## 2017-08-05 ENCOUNTER — Encounter: Payer: Self-pay | Admitting: Family Medicine

## 2017-08-19 DIAGNOSIS — G4733 Obstructive sleep apnea (adult) (pediatric): Secondary | ICD-10-CM | POA: Diagnosis not present

## 2017-08-20 ENCOUNTER — Other Ambulatory Visit: Payer: Self-pay | Admitting: Family Medicine

## 2017-08-20 MED FILL — SIMVASTATIN 20 MG TABLET: 20 | 90 days supply | Qty: 135 | Fill #1

## 2017-09-30 ENCOUNTER — Ambulatory Visit: Payer: 59 | Admitting: Family Medicine

## 2017-10-07 ENCOUNTER — Ambulatory Visit: Payer: 59 | Admitting: Family Medicine

## 2017-10-19 ENCOUNTER — Encounter: Payer: 59 | Admitting: Psychology

## 2017-10-25 MED FILL — FUROSEMIDE 20 MG TABS: 20 | 90 days supply | Qty: 90 | Fill #0

## 2017-11-01 ENCOUNTER — Encounter: Payer: 59 | Admitting: Psychology

## 2017-11-18 DIAGNOSIS — G4733 Obstructive sleep apnea (adult) (pediatric): Secondary | ICD-10-CM | POA: Diagnosis not present

## 2017-11-30 ENCOUNTER — Ambulatory Visit (INDEPENDENT_AMBULATORY_CARE_PROVIDER_SITE_OTHER): Payer: 59 | Admitting: Family Medicine

## 2017-11-30 ENCOUNTER — Encounter: Payer: Self-pay | Admitting: Family Medicine

## 2017-11-30 VITALS — BP 120/72 | HR 69 | Temp 98.1°F | Ht 69.5 in | Wt 265.6 lb

## 2017-11-30 DIAGNOSIS — Z125 Encounter for screening for malignant neoplasm of prostate: Secondary | ICD-10-CM | POA: Diagnosis not present

## 2017-11-30 DIAGNOSIS — I10 Essential (primary) hypertension: Secondary | ICD-10-CM | POA: Diagnosis not present

## 2017-11-30 DIAGNOSIS — G4733 Obstructive sleep apnea (adult) (pediatric): Secondary | ICD-10-CM

## 2017-11-30 DIAGNOSIS — K760 Fatty (change of) liver, not elsewhere classified: Secondary | ICD-10-CM | POA: Diagnosis not present

## 2017-11-30 DIAGNOSIS — Z1331 Encounter for screening for depression: Secondary | ICD-10-CM | POA: Diagnosis not present

## 2017-11-30 DIAGNOSIS — Z0000 Encounter for general adult medical examination without abnormal findings: Secondary | ICD-10-CM | POA: Diagnosis not present

## 2017-11-30 DIAGNOSIS — E538 Deficiency of other specified B group vitamins: Secondary | ICD-10-CM

## 2017-11-30 DIAGNOSIS — R739 Hyperglycemia, unspecified: Secondary | ICD-10-CM

## 2017-11-30 DIAGNOSIS — E785 Hyperlipidemia, unspecified: Secondary | ICD-10-CM | POA: Diagnosis not present

## 2017-11-30 NOTE — Progress Notes (Signed)
HPI:  Using dictation device. Unfortunately this device frequently misinterprets words/phrases.  Here for CPE:  -Concerns and/or follow up today:  Travis Martinez is a pleasant 57 year old with a past medical history significant for morbid obesity, hypertension, hyperglycemia, hyperlipidemia, sleep apnea, fatty liver, (history of transient visual disturbance saw neurology, on aspirin per their notes- 2018) and B12 deficiency here for his physical and for follow-up, as he missed his follow-up appointment.  He is on Zocor for his cholesterol, and takes  Lasix for hypertension and edema. At his last visit he had made significant progress with lifestyle changes with a 20 pound weight loss.  Today he reports diet has been a bit of a yoyo, still feels is eating healthier overall and reports is getting 150 minutes of exercise per week. Reports wants to check labs, is fasting. He wants to check b12 - reports put on b12 by neurology and taking 1049mg daily. No sig numbness, paresthesias.  -Diabetes and Dyslipidemia Screening: -Hx of HTN: no -Vaccines: UTD -sexual activity: yes, male partner, no new partners -wants STI testing, Hep C screening (if born 176-1964: no -FH colon or prstate ca: see FH Last colon cancer screening: UTD per epic  Last prostate ca screening: discussed risks and benefits of DRE and PSA, he declined DRE, but did want to do PSA -Alcohol, Tobacco, drug use: see social history  Review of Systems - no fevers, unintentional weight loss, vision loss, hearing loss, chest pain, sob, hemoptysis, melena, hematochezia, hematuria, genital discharge, changing or concerning skin lesions, bleeding, bruising, loc, thoughts of self harm or SI  Past Medical History:  Diagnosis Date  . Abnormal glucose   . Arthritis   . Cataract    surgically removed right eye   . ED (erectile dysfunction) of organic origin   . Glucose intolerance (malabsorption)   . Hyperlipidemia   . Hypertension     . Obesity   . PVC (premature ventricular contraction)   . Sleep apnea    reports used CPAP in the past, was loosing weight  . Umbilical hernia    ventral hernia, following GB surgery    Past Surgical History:  Procedure Laterality Date  . APPENDECTOMY    . CATARACT EXTRACTION    . HERNIA REPAIR    . mouth growth removal   03/16/2016  . TONSILLECTOMY      Family History  Problem Relation Age of Onset  . Bipolar disorder Mother   . Arthritis Mother   . Colon polyps Mother   . Hyperlipidemia Mother   . Hypertension Mother   . Colon polyps Father   . Hyperlipidemia Father   . Hypertension Father   . Intracerebral hemorrhage Unknown   . Heart attack Maternal Grandfather 50  . Colon cancer Neg Hx   . Esophageal cancer Neg Hx   . Rectal cancer Neg Hx   . Stomach cancer Neg Hx     Social History   Socioeconomic History  . Marital status: Significant Other    Spouse name: Not on file  . Number of children: 2  . Years of education: Not on file  . Highest education level: Not on file  Occupational History  . Occupation: [employer]  Social Needs  . Financial resource strain: Not on file  . Food insecurity:    Worry: Not on file    Inability: Not on file  . Transportation needs:    Medical: Not on file    Non-medical: Not on file  Tobacco  Use  . Smoking status: Never Smoker  . Smokeless tobacco: Never Used  Substance and Sexual Activity  . Alcohol use: Yes    Alcohol/week: 1.2 - 3.6 oz    Types: 2 - 6 Cans of beer per week  . Drug use: No  . Sexual activity: Not on file  Lifestyle  . Physical activity:    Days per week: Not on file    Minutes per session: Not on file  . Stress: Not on file  Relationships  . Social connections:    Talks on phone: Not on file    Gets together: Not on file    Attends religious service: Not on file    Active member of club or organization: Not on file    Attends meetings of clubs or organizations: Not on file    Relationship  status: Not on file  Other Topics Concern  . Not on file  Social History Narrative   Lives in 2 story home with his significant other   Has 2 adult children   Highest level of education: bachelor's degree with some post grad   Works in Engineer, technical sales for [employer]      Current Outpatient Medications:  .  aspirin 81 MG tablet, Take 81 mg by mouth daily.  , Disp: , Rfl:  .  cyanocobalamin 500 MCG tablet, Take 500 mcg by mouth daily., Disp: , Rfl:  .  furosemide (LASIX) 20 MG tablet, TAKE 1 TABLET BY MOUTH DAILY., Disp: 90 tablet, Rfl: 1 .  simvastatin (ZOCOR) 20 MG tablet, TAKE 1 AND 1/2 TABLETS BY MOUTH AT BEDTIME, Disp: 135 tablet, Rfl: 1  Current Facility-Administered Medications:  .  0.9 %  sodium chloride infusion, 500 mL, Intravenous, Continuous, Pyrtle, Lajuan Lines, MD  EXAM:  Vitals:   11/30/17 1558  BP: 120/72  Pulse: 69  Temp: 98.1 F (36.7 C)  TempSrc: Oral  Weight: 265 lb 9.6 oz (120.5 kg)  Height: 5' 9.5" (1.765 m)    Estimated body mass index is 38.66 kg/m as calculated from the following:   Height as of this encounter: 5' 9.5" (1.765 m).   Weight as of this encounter: 265 lb 9.6 oz (120.5 kg).  GENERAL: vitals reviewed and listed below, alert, oriented, appears well hydrated and in no acute distress  HEENT: head atraumatic, PERRLA, normal appearance of eyes, ears, nose and mouth. moist mucus membranes.  NECK: supple, no masses or lymphadenopathy  LUNGS: clear to auscultation bilaterally, no rales, rhonchi or wheeze  CV: HRRR, no peripheral edema or cyanosis, normal pedal pulses  ABDOMEN: bowel sounds normal, soft, non tender to palpation, no masses, no rebound or guarding, diastasis recti  GU: declined  SKIN: no rash or abnormal lesions on exposed portions, declined full skin exam  MS: normal gait, moves all extremities normally  NEURO: normal gait, speech and thought processing grossly intact, muscle tone grossly intact throughout  PSYCH: normal affect,  pleasant and cooperative  ASSESSMENT AND PLAN:  Discussed the following assessment and plan:  PREVENTIVE EXAM: -Discussed and advised all [place] preventive services health task force level A and B recommendations for age, sex and risks. -Advised at least 150 minutes of exercise per week and a healthy diet with avoidance of (less then 1 serving per week) processed foods, white starches, red meat, fast foods and sweets and consisting of: * 5-9 servings of fresh fruits and vegetables (not corn or potatoes) *nuts and seeds, beans *olives and olive oil *lean meats such as  fish and white chicken  *whole grains -labs, studies and vaccines per orders this encounter - PSA after discussion risks/limitations/benefits  2. Screening for depression -see phq9  3. Morbid obesity ([place]) -lifestyle changes advise, advised healthy low sugar diet, regular exercise for lifet  4. Hyperlipidemia, unspecified hyperlipidemia type - Lipid panel -continue statin  5. Essential hypertension -continue lasix - Basic metabolic panel - CBC  6. Obstructive sleep apnea -sees pulmonologist  7. Hyperglycemia - Hemoglobin A1c  8. Fatty liver -advised healthy lifestyle  9. B12 deficiency - check b12   Patient Instructions  BEFORE YOU LEAVE: -please provide him wt from last visit and wt today -labs -follow up: 3-4 months  We recommend a healthy low sugar diet and regular aerobic exercise with a goal for weight reduction of 10 to 20 pounds over the next 3 to 6 months.  We have ordered labs or studies at this visit. It can take up to 1-2 weeks for results and processing. IF results require follow up or explanation, we will call you with instructions. Clinically stable results will be released to your [facility]. If you have not heard from [place] or cannot find your results in [facility] in 2 weeks please contact our office at [phone].  If you are not yet signed up for [facility], please consider signing  up.    Preventive Care 40-64 Years, Male Preventive care refers to lifestyle choices and visits with your health care provider that can promote health and wellness. What does preventive care include?  A yearly physical exam. This is also called an annual well check.  Dental exams once or twice a year.  Routine eye exams. Ask your health care provider how often you should have your eyes checked.  Personal lifestyle choices, including: ? Daily care of your teeth and gums. ? Regular physical activity. ? Eating a healthy diet. ? Avoiding tobacco and drug use. ? Limiting alcohol use. ? Practicing safe sex. ? Taking low-dose aspirin every day starting at age 30. What happens during an annual well check? The services and screenings done by your health care provider during your annual well check will depend on your age, overall health, lifestyle risk factors, and family history of disease. Counseling Your health care provider may ask you questions about your:  Alcohol use.  Tobacco use.  Drug use.  Emotional well-being.  Home and relationship well-being.  Sexual activity.  Eating habits.  Work and work Statistician.  Screening You may have the following tests or measurements:  Height, weight, and BMI.  Blood pressure.  Lipid and cholesterol levels. These may be checked every 5 years, or more frequently if you are over 10 years old.  Skin check.  Lung cancer screening. You may have this screening every year starting at age 92 if you have a 30-pack-year history of smoking and currently smoke or have quit within the past 15 years.  Fecal occult blood test (FOBT) of the stool. You may have this test every year starting at age 39.  Flexible sigmoidoscopy or colonoscopy. You may have a sigmoidoscopy every 5 years or a colonoscopy every 10 years starting at age 59.  Prostate cancer screening. Recommendations will vary depending on your family history and other  risks.  Hepatitis C blood test.  Hepatitis B blood test.  Sexually transmitted disease (STD) testing.  Diabetes screening. This is done by checking your blood sugar (glucose) after you have not eaten for a while (fasting). You may have this done every 1-3  years.  Discuss your test results, treatment options, and if necessary, the need for more tests with your health care provider. Vaccines Your health care provider may recommend certain vaccines, such as:  Influenza vaccine. This is recommended every year.  Tetanus, diphtheria, and acellular pertussis (Tdap, Td) vaccine. You may need a Td booster every 10 years.  Varicella vaccine. You may need this if you have not been vaccinated.  Zoster vaccine. You may need this after age 55.  Measles, mumps, and rubella (MMR) vaccine. You may need at least one dose of MMR if you were born in 1957 or later. You may also need a second dose.  Pneumococcal 13-valent conjugate (PCV13) vaccine. You may need this if you have certain conditions and have not been vaccinated.  Pneumococcal polysaccharide (PPSV23) vaccine. You may need one or two doses if you smoke cigarettes or if you have certain conditions.  Meningococcal vaccine. You may need this if you have certain conditions.  Hepatitis A vaccine. You may need this if you have certain conditions or if you travel or work in places where you may be exposed to hepatitis A.  Hepatitis B vaccine. You may need this if you have certain conditions or if you travel or work in places where you may be exposed to hepatitis B.  Haemophilus influenzae type b (Hib) vaccine. You may need this if you have certain risk factors.  Talk to your health care provider about which screenings and vaccines you need and how often you need them. This information is not intended to replace advice given to you by your health care provider. Make sure you discuss any questions you have with your health care provider. Document  Released: 08/09/2015 Document Revised: 04/01/2016 Document Reviewed: 05/14/2015 Elsevier Interactive Patient Education  2018 [place] NOW OFFER   [facility] FAST TRACK!!!  SAME DAY Appointments for ACUTE CARE  Such as: Sprains, Injuries, cuts, abrasions, rashes, muscle pain, joint pain, back pain Colds, flu, sore throats, headache, allergies, cough, fever  Ear pain, sinus and eye infections Abdominal pain, nausea, vomiting, diarrhea, upset stomach Animal/insect bites  3 Easy Ways to Schedule: Walk-In Scheduling Call in scheduling Mychart Sign-up: [email or web address]              No follow-ups on file.   Lucretia Kern, DO

## 2017-11-30 NOTE — Patient Instructions (Signed)
BEFORE YOU LEAVE: -please provide him wt from last visit and wt today -labs -follow up: 3-4 months  We recommend a healthy low sugar diet and regular aerobic exercise with a goal for weight reduction of 10 to 20 pounds over the next 3 to 6 months.  We have ordered labs or studies at this visit. It can take up to 1-2 weeks for results and processing. IF results require follow up or explanation, we will call you with instructions. Clinically stable results will be released to your MYCHART. If you have not heard from us or cannot find your results in MYCHART in 2 weeks please contact our office at [phone].  If you are not yet signed up for MYCHART, please consider signing up.    Preventive Care 40-64 Years, Male Preventive care refers to lifestyle choices and visits with your health care provider that can promote health and wellness. What does preventive care include?  A yearly physical exam. This is also called an annual well check.  Dental exams once or twice a year.  Routine eye exams. Ask your health care provider how often you should have your eyes checked.  Personal lifestyle choices, including: ? Daily care of your teeth and gums. ? Regular physical activity. ? Eating a healthy diet. ? Avoiding tobacco and drug use. ? Limiting alcohol use. ? Practicing safe sex. ? Taking low-dose aspirin every day starting at age 50. What happens during an annual well check? The services and screenings done by your health care provider during your annual well check will depend on your age, overall health, lifestyle risk factors, and family history of disease. Counseling Your health care provider may ask you questions about your:  Alcohol use.  Tobacco use.  Drug use.  Emotional well-being.  Home and relationship well-being.  Sexual activity.  Eating habits.  Work and work environment.  Screening You may have the following tests or measurements:  Height, weight, and  BMI.  Blood pressure.  Lipid and cholesterol levels. These may be checked every 5 years, or more frequently if you are over 50 years old.  Skin check.  Lung cancer screening. You may have this screening every year starting at age 55 if you have a 30-pack-year history of smoking and currently smoke or have quit within the past 15 years.  Fecal occult blood test (FOBT) of the stool. You may have this test every year starting at age 50.  Flexible sigmoidoscopy or colonoscopy. You may have a sigmoidoscopy every 5 years or a colonoscopy every 10 years starting at age 50.  Prostate cancer screening. Recommendations will vary depending on your family history and other risks.  Hepatitis C blood test.  Hepatitis B blood test.  Sexually transmitted disease (STD) testing.  Diabetes screening. This is done by checking your blood sugar (glucose) after you have not eaten for a while (fasting). You may have this done every 1-3 years.  Discuss your test results, treatment options, and if necessary, the need for more tests with your health care provider. Vaccines Your health care provider may recommend certain vaccines, such as:  Influenza vaccine. This is recommended every year.  Tetanus, diphtheria, and acellular pertussis (Tdap, Td) vaccine. You may need a Td booster every 10 years.  Varicella vaccine. You may need this if you have not been vaccinated.  Zoster vaccine. You may need this after age 60.  Measles, mumps, and rubella (MMR) vaccine. You may need at least one dose of MMR if you were   born in 1957 or later. You may also need a second dose.  Pneumococcal 13-valent conjugate (PCV13) vaccine. You may need this if you have certain conditions and have not been vaccinated.  Pneumococcal polysaccharide (PPSV23) vaccine. You may need one or two doses if you smoke cigarettes or if you have certain conditions.  Meningococcal vaccine. You may need this if you have certain  conditions.  Hepatitis A vaccine. You may need this if you have certain conditions or if you travel or work in places where you may be exposed to hepatitis A.  Hepatitis B vaccine. You may need this if you have certain conditions or if you travel or work in places where you may be exposed to hepatitis B.  Haemophilus influenzae type b (Hib) vaccine. You may need this if you have certain risk factors.  Talk to your health care provider about which screenings and vaccines you need and how often you need them. This information is not intended to replace advice given to you by your health care provider. Make sure you discuss any questions you have with your health care provider. Document Released: 08/09/2015 Document Revised: 04/01/2016 Document Reviewed: 05/14/2015 Elsevier Interactive Patient Education  2018 [employer] Inc[employer]    WE NOW OFFER   [facility] FAST TRACK!!!  SAME DAY Appointments for ACUTE CARE  Such as: Sprains, Injuries, cuts, abrasions, rashes, muscle pain, joint pain, back pain Colds, flu, sore throats, headache, allergies, cough, fever  Ear pain, sinus and eye infections Abdominal pain, nausea, vomiting, diarrhea, upset stomach Animal/insect bites  3 Easy Ways to Schedule: Walk-In Scheduling Call in scheduling Mychart Sign-up: [email or web address]            

## 2017-12-01 LAB — CBC
HCT: 49.2 % (ref 39.0–52.0)
Hemoglobin: 17 g/dL (ref 13.0–17.0)
MCHC: 34.4 g/dL (ref 30.0–36.0)
MCV: 89.4 fl (ref 78.0–100.0)
Platelets: 189 10*3/uL (ref 150.0–400.0)
RBC: 5.51 Mil/uL (ref 4.22–5.81)
RDW: 13.2 % (ref 11.5–15.5)
WBC: 6.4 10*3/uL (ref 4.0–10.5)

## 2017-12-01 LAB — LIPID PANEL
CHOLESTEROL: 213 mg/dL — AB (ref 0–200)
HDL: 42.5 mg/dL (ref >39.00)
NonHDL: 170.88
TRIGLYCERIDES: 292 mg/dL — AB (ref 0.0–149.0)
Total CHOL/HDL Ratio: 5
VLDL: 58.4 mg/dL — ABNORMAL HIGH (ref 0.0–40.0)

## 2017-12-01 LAB — BASIC METABOLIC PANEL
BUN: 22 mg/dL (ref 6–23)
CALCIUM: 10 mg/dL (ref 8.4–10.5)
CO2: 24 meq/L (ref 19–32)
CREATININE: 1 mg/dL (ref 0.40–1.50)
Chloride: 102 mEq/L (ref 96–112)
GFR: 82.09 mL/min (ref >60.00)
GLUCOSE: 97 mg/dL (ref 70–99)
Potassium: 3.9 mEq/L (ref 3.5–5.1)
Sodium: 141 mEq/L (ref 135–145)

## 2017-12-01 LAB — PSA: PSA: 1.67 ng/mL (ref 0.10–4.00)

## 2017-12-01 LAB — LDL CHOLESTEROL, DIRECT: Direct LDL: 128 mg/dL

## 2017-12-01 LAB — VITAMIN B12: VITAMIN B 12: 898 pg/mL (ref 211–911)

## 2017-12-01 LAB — HEMOGLOBIN A1C: Hgb A1c MFr Bld: 5.8 % (ref 4.6–6.5)

## 2017-12-01 NOTE — Addendum Note (Signed)
Addended by: Tomi Likens on: 12/01/2017 08:30 AM   Modules accepted: Orders

## 2017-12-02 MED ORDER — SIMVASTATIN 40 MG PO TABS: 40.0000 mg | ORAL_TABLET | Freq: Every day | ORAL | 1 refills | Status: DC

## 2017-12-02 MED FILL — SIMVASTATIN 40 MG TABLET: 40 | 90 days supply | Qty: 90 | Fill #0

## 2017-12-02 NOTE — Addendum Note (Signed)
Addended by: Agnes Lawrence on: 12/02/2017 08:21 AM   Modules accepted: Orders

## 2018-02-17 MED FILL — FUROSEMIDE 20 MG TABS: 20 | 90 days supply | Qty: 90 | Fill #1

## 2018-02-23 DIAGNOSIS — G4733 Obstructive sleep apnea (adult) (pediatric): Secondary | ICD-10-CM | POA: Diagnosis not present

## 2018-04-06 ENCOUNTER — Other Ambulatory Visit: Payer: Self-pay | Admitting: Family Medicine

## 2018-04-06 MED FILL — SIMVASTATIN 40 MG TABS: 40 | 90 days supply | Qty: 90 | Fill #1

## 2018-05-09 DIAGNOSIS — H905 Unspecified sensorineural hearing loss: Secondary | ICD-10-CM | POA: Diagnosis not present

## 2018-05-10 MED FILL — FUROSEMIDE 20 MG TABS: 20 | 90 days supply | Qty: 90 | Fill #0

## 2018-05-27 DIAGNOSIS — G4733 Obstructive sleep apnea (adult) (pediatric): Secondary | ICD-10-CM | POA: Diagnosis not present

## 2018-06-01 DIAGNOSIS — Z0100 Encounter for examination of eyes and vision without abnormal findings: Secondary | ICD-10-CM | POA: Diagnosis not present

## 2018-06-01 LAB — HM DIABETES EYE EXAM

## 2018-06-09 ENCOUNTER — Ambulatory Visit: Payer: 59 | Admitting: Family Medicine

## 2018-06-09 ENCOUNTER — Encounter: Payer: Self-pay | Admitting: Family Medicine

## 2018-06-09 ENCOUNTER — Other Ambulatory Visit (INDEPENDENT_AMBULATORY_CARE_PROVIDER_SITE_OTHER): Payer: 59

## 2018-06-09 ENCOUNTER — Ambulatory Visit: Payer: Self-pay | Admitting: *Deleted

## 2018-06-09 VITALS — BP 142/94 | Temp 98.9°F | Wt 289.1 lb

## 2018-06-09 DIAGNOSIS — R5383 Other fatigue: Secondary | ICD-10-CM

## 2018-06-09 DIAGNOSIS — I10 Essential (primary) hypertension: Secondary | ICD-10-CM

## 2018-06-09 LAB — BASIC METABOLIC PANEL
BUN: 17 mg/dL (ref 6–23)
CALCIUM: 9.9 mg/dL (ref 8.4–10.5)
CO2: 27 mEq/L (ref 19–32)
Chloride: 103 mEq/L (ref 96–112)
Creatinine, Ser: 1.07 mg/dL (ref 0.40–1.50)
GFR: 75.78 mL/min (ref >60.00)
GLUCOSE: 104 mg/dL — AB (ref 70–99)
POTASSIUM: 3.9 meq/L (ref 3.5–5.1)
SODIUM: 140 meq/L (ref 135–145)

## 2018-06-09 LAB — CBC WITH DIFFERENTIAL/PLATELET
BASOS PCT: 1 % (ref 0.0–3.0)
Basophils Absolute: 0.1 10*3/uL (ref 0.0–0.1)
EOS PCT: 5.8 % — AB (ref 0.0–5.0)
Eosinophils Absolute: 0.4 10*3/uL (ref 0.0–0.7)
HCT: 44.3 % (ref 39.0–52.0)
Hemoglobin: 15.4 g/dL (ref 13.0–17.0)
Lymphocytes Relative: 29 % (ref 12.0–46.0)
Lymphs Abs: 2 10*3/uL (ref 0.7–4.0)
MCHC: 34.8 g/dL (ref 30.0–36.0)
MCV: 88.7 fl (ref 78.0–100.0)
MONO ABS: 0.6 10*3/uL (ref 0.1–1.0)
MONOS PCT: 8.3 % (ref 3.0–12.0)
Neutro Abs: 3.8 10*3/uL (ref 1.4–7.7)
Neutrophils Relative %: 55.9 % (ref 43.0–77.0)
Platelets: 165 10*3/uL (ref 150.0–400.0)
RBC: 5 Mil/uL (ref 4.22–5.81)
RDW: 14.1 % (ref 11.5–15.5)
WBC: 6.8 10*3/uL (ref 4.0–10.5)

## 2018-06-09 LAB — TSH: TSH: 4.17 u[IU]/mL (ref 0.35–4.50)

## 2018-06-09 MED ORDER — LOSARTAN POTASSIUM 25 MG PO TABS: 25.0000 mg | ORAL_TABLET | Freq: Every day | ORAL | 0 refills | Status: DC

## 2018-06-09 MED FILL — LOSARTAN POTASSIUM 25 MG TA: 25 | 30 days supply | Qty: 30 | Fill #0

## 2018-06-09 NOTE — Progress Notes (Addendum)
Subjective:    Patient ID: Travis Martinez, male    DOB: [birth year], 57 y.o.   MRN: [id number]  HPI  Travis Martinez is a 57 year old male who presents today for a blood pressure evaluation. He reports donating blood yesterday and he was informed his BP was 150/107. He also has been checking his BP at [facility] and with his Dad's BP cuff and reports elevated diastolic readings in the high 90's. He reports using an arm cuff and states that the arm cuffs at [facility] have been the primary source of readings and his father's cuff is an extra large size. He denies chest pain, palpitations, SOB, numbness, tingling, weakness, headaches, or edema.  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 He reports feeling more fatigued after working which he describes as "extensive" work on a farm. He was losing weight however he is not losing but reports gaining noting increased stress with taking care of aging parents.  Reports little salt intake and he does monitor salt in his diet. He states that he drinks "little water" and can improve in this area.   Fatigue is described as being "more tired" after working on his farm Symptom of fatigue started approximately one month ago Aggravating factors: Physical activity Alleviating factors: Rest Depressed or anxious mood: No Increase in stressors: yes, taking care of aging parents Fatigue has impacted daily activities: Not at this time ETOH use:yes Recreational drug use: No  Denies fever, chills, sweats, chest pain, palpitations, cough, SOB, change in bowel habits/blood in stool, dizziness and weakness.  Wt Readings from Last 3 Encounters:  06/09/18 289 lb 1.3 oz (131.1 kg)  11/30/17 265 lb 9.6 oz (120.5 kg)  07/01/17 259 lb 14.4 oz (117.9 kg)    Review of Systems  Constitutional: Positive for fatigue. Negative for chills and fever.  Respiratory: Negative for cough, shortness of breath and wheezing.     Cardiovascular: Negative for chest pain and palpitations.  Gastrointestinal: Negative for abdominal pain.  Musculoskeletal: Negative for back pain.  Neurological: Negative for dizziness, weakness and headaches.  Psychiatric/Behavioral:       Denies depressed or anxious mood    See HPI for further details   Past Medical History:  Diagnosis Date  . Abnormal glucose   . Arthritis   . Cataract    surgically removed right eye   . ED (erectile dysfunction) of organic origin   . Glucose intolerance (malabsorption)   . Hyperlipidemia   . Hypertension   . Obesity   . PVC (premature ventricular contraction)   . Sleep apnea    reports used CPAP in the past, was loosing weight  . Umbilical hernia    ventral hernia, following GB surgery     Social History   Socioeconomic History  . Marital status: Significant Other    Spouse name: Not on file  . Number of children: 2  . Years of education: Not on file  . Highest education level: Not on file  Occupational History  . Occupation: [employer]  Social Needs  . Financial resource strain: Not on file  . Food insecurity:    Worry: Not on file    Inability: Not on file  . Transportation needs:    Medical: Not on file    Non-medical: Not on file  Tobacco Use  . Smoking status: Never Smoker  . Smokeless tobacco: Never Used  Substance and Sexual Activity  . Alcohol use: Yes    Alcohol/week: 2.0 - 6.0 standard drinks    Types: 2 - 6 Cans of beer per week  . Drug use: No  . Sexual activity: Not on file  Lifestyle  . Physical activity:    Days per week: Not on file    Minutes per session: Not on file  . Stress: Not on file  Relationships  . Social connections:    Talks on phone: Not on file    Gets together: Not on file    Attends religious service: Not on file    Active member of club or organization: Not on file    Attends meetings of clubs or organizations: Not on file    Relationship status: Not on file  . Intimate partner  violence:    Fear of current or ex partner: Not on file    Emotionally abused: Not on file    Physically abused: Not on file    Forced sexual activity: Not on file  Other Topics Concern  . Not on file  Social History Narrative   Lives in 2 story home with his significant other   Has 2 adult children   Highest level of education: bachelor's degree with some post grad   Works in Engineer, technical sales for [employer]     Past Surgical History:  Procedure Laterality Date  . APPENDECTOMY    . CATARACT EXTRACTION    . HERNIA REPAIR    . mouth growth removal   03/16/2016  . TONSILLECTOMY      Family History  Problem Relation Age of Onset  . Bipolar disorder Mother   . Arthritis Mother   . Colon polyps Mother   . Hyperlipidemia Mother   . Hypertension Mother   . Colon polyps Father   . Hyperlipidemia Father   . Hypertension Father   . Intracerebral hemorrhage Unknown   . Heart attack Maternal Grandfather 50  . Colon cancer Neg Hx   . Esophageal cancer Neg Hx   . Rectal cancer Neg Hx   . Stomach cancer Neg Hx     No Known Allergies  Current Outpatient Medications on File Prior to Visit  Medication Sig Dispense Refill  . aspirin 81 MG tablet Take 81 mg by mouth daily.      . cyanocobalamin 500 MCG tablet Take 500 mcg by mouth daily.    . furosemide (LASIX) 20 MG tablet TAKE 1 TABLET BY MOUTH ONCE DAILY 90 tablet 1  . simvastatin (ZOCOR) 40 MG tablet Take 1 tablet (40 mg total) by mouth at bedtime. 90 tablet 1   Current Facility-Administered Medications on File Prior to Visit  Medication Dose Route Frequency Provider Last Rate Last Dose  . 0.9 %  sodium chloride infusion  500 mL Intravenous Continuous Pyrtle, Lajuan Lines, MD        BP (!) 142/94   Temp 98.9 F (37.2 C) (Oral)   Wt 289 lb 1.3 oz (131.1 kg)   BMI 42.08 kg/m       Objective:   Physical Exam  Constitutional: He is oriented to person, place, and time. He appears well-developed and well-nourished.  Obese  HENT:    Mouth/Throat: Oropharynx is clear and moist.  Eyes: Pupils are equal, round, and reactive to light. No scleral icterus.  Neck: Neck supple.  Cardiovascular: Normal rate, regular rhythm and intact distal pulses.  Pulmonary/Chest: Effort normal and breath sounds normal. He has no wheezes. He  has no rales.  Abdominal: Soft. Bowel sounds are normal. There is no tenderness.  Musculoskeletal: He exhibits no edema.  Lymphadenopathy:    He has no cervical adenopathy.  Neurological: He is alert and oriented to person, place, and time. He has normal strength.  Skin: Skin is warm and dry. Capillary refill takes less than 2 seconds.  Psychiatric: He has a normal mood and affect. His behavior is normal. Judgment and thought content normal.      Assessment & Plan:  1. Essential hypertension Report of elevated readings and elevated reading in office today with history of weight gain present. We discussed the importance of monitoring BP. Provided instruction of monitoring BP once daily, document readings, appropriate cuff size (X-large), and will initiate a second agent today. Considered amlodipine however he is taking simvastatin 40 mg and the combination is not indicated unless simvastatin is reduced. Will choose losartan today and advised him to follow up with PCP in the next 1 to 2 weeks to discuss long term management of blood pressure agents. He is currently taking once daily furosemide and we discussed that this may be changed as there are combination agents and lab work will be followed. He will monitor readings and bring them with him to his PCP. He will also follow up sooner if readings are not improving with second agent.  - losartan (COZAAR) 25 MG tablet; Take 1 tablet (25 mg total) by mouth daily.  Dispense: 30 tablet; Refill: 0  2. Fatigue, unspecified type Exam is reassuring. No cardiopulmonary symptoms today. Weight gain present. Morbid obesity. Advised lifestyle changes with low sugar, low  sodium diet. Will check lab work today and advised weight loss and follow up with PCP if symptom persists. Further advised him the importance of seeking immediate medical attention if he has any chest pain and especially chest pain, SOB with activity.   - Basic metabolic panel - TSH - CBC with Differential/Platelet  Follow up with PCP in 1 to 2 weeks or sooner if needed.  Delano Metz, FNP-C

## 2018-06-09 NOTE — Telephone Encounter (Signed)
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 Patient states he had his BP checked yesterday and it was 162/112- he does not know what it is today- he is at work- and reports no symptoms at this time. Because patient is at risk- appointment given for evaluation of BP today- he may need medication adjustment or start.  Reason for Disposition . Systolic BP  >= 072 OR Diastolic >= 257    Patient scheduled for BP check and evaluation  Answer Assessment - Initial Assessment Questions 1. BLOOD PRESSURE: "What is the blood pressure?" "Did you take at least two measurements 5 minutes apart?"     Last 2 weeks elevations- 150/160 /105/118     Checked yesterday 163/112 2. ONSET: "When did you take your blood pressure?"     yesterday 3. HOW: "How did you obtain the blood pressure?" (e.g., visiting nurse, automatic home BP monitor)     Automatic cuff and manual 4. HISTORY: "Do you have a history of high blood pressure?"     diuretic only  5. MEDICATIONS: "Are you taking any medications for blood pressure?" "Have you missed any doses recently?"     furosemide  6. OTHER SYMPTOMS: "Do you have any symptoms?" (e.g., headache, chest pain, blurred vision, difficulty breathing, weakness)     Not felt well- has increased physical activity, chest pain 30-40 % time, some SOB- with active, weakness 7. PREGNANCY: "Is there any chance you are pregnant?" "When was your last menstrual period?"     n/a  Protocols used: HIGH BLOOD PRESSURE-A-AH

## 2018-06-09 NOTE — Patient Instructions (Signed)
Please take medication as directed.  Monitor BP once daily and document reading to bring to your appointment with Dr. Maudie Mercury. See BP information below.  We have ordered labs or studies at this visit. It can take up to 1-2 weeks for results and processing. IF results require follow up or explanation, we will call you with instructions. Clinically stable results will be released to your [facility]. If you have not heard from [place] or cannot find your results in [facility] in 2 weeks please contact our office.  Watch salt intake and see DASH eating recommendations below.   Minimal Blood Pressure Goal= AVERAGE < 140/90; Ideal is an AVERAGE < 135/85. This AVERAGE should be calculated from @ least 5-7 BP readings taken @ different times of day on different days of week. You should not respond to isolated BP readings , but rather the AVERAGE for that week .Please bring your blood pressure cuff to office visits to verify that it is reliable.It can also be checked against the blood pressure device at the pharmacy. Finger or wrist cuffs are not dependable; an arm cuff is.   DASH Eating Plan DASH stands for "Dietary Approaches to Stop Hypertension." The DASH eating plan is a healthy eating plan that has been shown to reduce high blood pressure (hypertension). It may also reduce your risk for type 2 diabetes, heart disease, and stroke. The DASH eating plan may also help with weight loss. What are tips for following this plan? General guidelines  Avoid eating more than 2,300 mg (milligrams) of salt (sodium) a day. If you have hypertension, you may need to reduce your sodium intake to 1,500 mg a day.  Limit alcohol intake to no more than 1 drink a day for nonpregnant women and 2 drinks a day for men. One drink equals 12 oz of beer, 5 oz of wine, or 1 oz of hard liquor.  Work with your health care provider to maintain a healthy body weight or to lose weight. Ask what an ideal weight is for you.  Get at least 30  minutes of exercise that causes your heart to beat faster (aerobic exercise) most days of the week. Activities may include walking, swimming, or biking.  Work with your health care provider or diet and nutrition specialist (dietitian) to adjust your eating plan to your individual calorie needs. Reading food labels  Check food labels for the amount of sodium per serving. Choose foods with less than 5 percent of the Daily Value of sodium. Generally, foods with less than 300 mg of sodium per serving fit into this eating plan.  To find whole grains, look for the word "whole" as the first word in the ingredient list. Shopping  Buy products labeled as "low-sodium" or "no salt added."  Buy fresh foods. Avoid canned foods and premade or frozen meals. Cooking  Avoid adding salt when cooking. Use salt-free seasonings or herbs instead of table salt or sea salt. Check with your health care provider or pharmacist before using salt substitutes.  Do not fry foods. Cook foods using healthy methods such as baking, boiling, grilling, and broiling instead.  Cook with heart-healthy oils, such as olive, canola, soybean, or sunflower oil. Meal planning   Eat a balanced diet that includes: ? 5 or more servings of fruits and vegetables each day. At each meal, try to fill half of your plate with fruits and vegetables. ? Up to 6-8 servings of whole grains each day. ? Less than 6 oz of lean  meat, poultry, or fish each day. A 3-oz serving of meat is about the same size as a deck of cards. One egg equals 1 oz. ? 2 servings of low-fat dairy each day. ? A serving of nuts, seeds, or beans 5 times each week. ? Heart-healthy fats. Healthy fats called Omega-3 fatty acids are found in foods such as flaxseeds and coldwater fish, like sardines, salmon, and mackerel.  Limit how much you eat of the following: ? Canned or prepackaged foods. ? Food that is high in trans fat, such as fried foods. ? Food that is high in  saturated fat, such as fatty meat. ? Sweets, desserts, sugary drinks, and other foods with added sugar. ? Full-fat dairy products.  Do not salt foods before eating.  Try to eat at least 2 vegetarian meals each week.  Eat more home-cooked food and less restaurant, buffet, and fast food.  When eating at a restaurant, ask that your food be prepared with less salt or no salt, if possible. What foods are recommended? The items listed may not be a complete list. Talk with your dietitian about what dietary choices are best for you. Grains Whole-grain or whole-wheat bread. Whole-grain or whole-wheat pasta. Brown rice. [place]. Bulgur. Whole-grain and low-sodium cereals. Pita bread. Low-fat, low-sodium crackers. Whole-wheat flour tortillas. Vegetables Fresh or frozen vegetables (raw, steamed, roasted, or grilled). Low-sodium or reduced-sodium tomato and vegetable juice. Low-sodium or reduced-sodium tomato sauce and tomato paste. Low-sodium or reduced-sodium canned vegetables. Fruits All fresh, dried, or frozen fruit. Canned fruit in natural juice (without added sugar). Meat and other protein foods Skinless chicken or [place]. Ground chicken or [place]. Pork with fat trimmed off. Fish and seafood. Egg whites. Dried beans, peas, or lentils. Unsalted nuts, nut butters, and seeds. Unsalted canned beans. Lean cuts of beef with fat trimmed off. Low-sodium, lean deli meat. Dairy Low-fat (1%) or fat-free (skim) milk. Fat-free, low-fat, or reduced-fat cheeses. Nonfat, low-sodium ricotta or cottage cheese. Low-fat or nonfat yogurt. Low-fat, low-sodium cheese. Fats and oils Soft margarine without trans fats. Vegetable oil. Low-fat, reduced-fat, or light mayonnaise and salad dressings (reduced-sodium). Canola, safflower, olive, soybean, and sunflower oils. Avocado. Seasoning and other foods Herbs. Spices. Seasoning mixes without salt. Unsalted popcorn and pretzels. Fat-free sweets. What foods are not  recommended? The items listed may not be a complete list. Talk with your dietitian about what dietary choices are best for you. Grains Baked goods made with fat, such as croissants, muffins, or some breads. Dry pasta or rice meal packs. Vegetables Creamed or fried vegetables. Vegetables in a cheese sauce. Regular canned vegetables (not low-sodium or reduced-sodium). Regular canned tomato sauce and paste (not low-sodium or reduced-sodium). Regular tomato and vegetable juice (not low-sodium or reduced-sodium). Angie Fava. Olives. Fruits Canned fruit in a light or heavy syrup. Fried fruit. Fruit in cream or butter sauce. Meat and other protein foods Fatty cuts of meat. Ribs. Fried meat. Berniece Salines. Sausage. Bologna and other processed lunch meats. Salami. Fatback. Hotdogs. Bratwurst. Salted nuts and seeds. Canned beans with added salt. Canned or smoked fish. Whole eggs or egg yolks. Chicken or [place] with skin. Dairy Whole or 2% milk, cream, and half-and-half. Whole or full-fat cream cheese. Whole-fat or sweetened yogurt. Full-fat cheese. Nondairy creamers. Whipped toppings. Processed cheese and cheese spreads. Fats and oils Butter. Stick margarine. Lard. Shortening. Ghee. Bacon fat. Tropical oils, such as coconut, palm kernel, or palm oil. Seasoning and other foods Salted popcorn and pretzels. Onion salt, garlic salt, seasoned salt, table salt,  and sea salt. Worcestershire sauce. Tartar sauce. Barbecue sauce. Teriyaki sauce. Soy sauce, including reduced-sodium. Steak sauce. Canned and packaged gravies. Fish sauce. Oyster sauce. Cocktail sauce. Horseradish that you find on the shelf. Ketchup. Mustard. Meat flavorings and tenderizers. Bouillon cubes. Hot sauce and Tabasco sauce. Premade or packaged marinades. Premade or packaged taco seasonings. Relishes. Regular salad dressings. Where to find more information:  National Heart, Lung, and [place]: https://wilson-e[place].com/  American Heart Association:  www.heart.org Summary  The DASH eating plan is a healthy eating plan that has been shown to reduce high blood pressure (hypertension). It may also reduce your risk for type 2 diabetes, heart disease, and stroke.  With the DASH eating plan, you should limit salt (sodium) intake to 2,300 mg a day. If you have hypertension, you may need to reduce your sodium intake to 1,500 mg a day.  When on the DASH eating plan, aim to eat more fresh fruits and vegetables, whole grains, lean proteins, low-fat dairy, and heart-healthy fats.  Work with your health care provider or diet and nutrition specialist (dietitian) to adjust your eating plan to your individual calorie needs. This information is not intended to replace advice given to you by your health care provider. Make sure you discuss any questions you have with your health care provider. Document Released: 07/02/2011 Document Revised: 07/06/2016 Document Reviewed: 07/06/2016 Elsevier Interactive Patient Education  [employer].

## 2018-07-13 ENCOUNTER — Encounter: Payer: Self-pay | Admitting: Family Medicine

## 2018-07-21 ENCOUNTER — Other Ambulatory Visit: Payer: Self-pay | Admitting: Family Medicine

## 2018-07-25 MED FILL — SIMVASTATIN 40 MG TABLET: 40 | 90 days supply | Qty: 90 | Fill #0

## 2018-08-22 ENCOUNTER — Encounter: Payer: Self-pay | Admitting: Family Medicine

## 2018-08-22 ENCOUNTER — Ambulatory Visit: Payer: 59 | Admitting: Family Medicine

## 2018-08-22 VITALS — BP 138/82 | HR 68 | Temp 98.3°F | Ht 69.5 in | Wt 277.2 lb

## 2018-08-22 DIAGNOSIS — M79672 Pain in left foot: Secondary | ICD-10-CM | POA: Diagnosis not present

## 2018-08-22 NOTE — Patient Instructions (Signed)
Nice to meet you  Please try to keep your feet clean  Please soak it with warm soapy water  Please try to change your dressing once a day  Please see me back on Friday if it doesn't improve

## 2018-08-23 DIAGNOSIS — M79672 Pain in left foot: Secondary | ICD-10-CM | POA: Insufficient documentation

## 2018-08-23 NOTE — Assessment & Plan Note (Signed)
Appears that he had a foreign body that formed the callus on the plantar aspect of his foot.  Does not appear to be infectious. -Foreign body removal today. -Counseled on dressing and changes. -If pain occurs or returns will need to follow-up for continued exploration of the area.  May need to get imaging.

## 2018-08-23 NOTE — Progress Notes (Signed)
Travis Martinez - 58 y.o. male MRN [id number]  Date of birth: 06/11/1961  SUBJECTIVE:  Including CC & ROS.  Chief Complaint  Patient presents with  . Pain    object in left foot/ painful to touch/ unable to get out/ happened about a month ago    Travis Martinez is a 58 y.o. male that is presenting with left foot pain.  The pain is been occurring since 1 January.  The pain is gotten worse recently.  He feels like there is something on the plantar aspect of his forefoot.  Denies any trauma or penetrating wound.  Has had to try to take it out but is not had any improvement.  The pain is getting worse.  Denies any fevers or chills.  No history of diabetes.  It is localized to the foot.  It is sharp and stabbing..   Review of Systems  Constitutional: Negative for fever.  HENT: Negative for congestion.   Respiratory: Negative for cough.   Cardiovascular: Negative for chest pain.  Gastrointestinal: Negative for abdominal pain.  Musculoskeletal: Positive for gait problem.  Skin: Negative for color change.  Neurological: Negative for weakness.  Hematological: Negative for adenopathy.  Psychiatric/Behavioral: Negative for agitation.    HISTORY: Past Medical, Surgical, Social, and Family History Reviewed & Updated per EMR.   Pertinent Historical Findings include:  Past Medical History:  Diagnosis Date  . Abnormal glucose   . Arthritis   . Cataract    surgically removed right eye   . ED (erectile dysfunction) of organic origin   . Glucose intolerance (malabsorption)   . Hyperlipidemia   . Hypertension   . Obesity   . PVC (premature ventricular contraction)   . Sleep apnea    reports used CPAP in the past, was loosing weight  . Umbilical hernia    ventral hernia, following GB surgery    Past Surgical History:  Procedure Laterality Date  . APPENDECTOMY    . CATARACT EXTRACTION    . HERNIA REPAIR    . mouth growth removal   03/16/2016  . TONSILLECTOMY      No Known  Allergies  Family History  Problem Relation Age of Onset  . Bipolar disorder Mother   . Arthritis Mother   . Colon polyps Mother   . Hyperlipidemia Mother   . Hypertension Mother   . Colon polyps Father   . Hyperlipidemia Father   . Hypertension Father   . Intracerebral hemorrhage Unknown   . Heart attack Maternal Grandfather 50  . Colon cancer Neg Hx   . Esophageal cancer Neg Hx   . Rectal cancer Neg Hx   . Stomach cancer Neg Hx      Social History   Socioeconomic History  . Marital status: Significant Other    Spouse name: Not on file  . Number of children: 2  . Years of education: Not on file  . Highest education level: Not on file  Occupational History  . Occupation: [employer]  Social Needs  . Financial resource strain: Not on file  . Food insecurity:    Worry: Not on file    Inability: Not on file  . Transportation needs:    Medical: Not on file    Non-medical: Not on file  Tobacco Use  . Smoking status: Never Smoker  . Smokeless tobacco: Never Used  Substance and Sexual Activity  . Alcohol use: Yes    Alcohol/week: 2.0 - 6.0 standard drinks  Types: 2 - 6 Cans of beer per week  . Drug use: No  . Sexual activity: Not on file  Lifestyle  . Physical activity:    Days per week: Not on file    Minutes per session: Not on file  . Stress: Not on file  Relationships  . Social connections:    Talks on phone: Not on file    Gets together: Not on file    Attends religious service: Not on file    Active member of club or organization: Not on file    Attends meetings of clubs or organizations: Not on file    Relationship status: Not on file  . Intimate partner violence:    Fear of current or ex partner: Not on file    Emotionally abused: Not on file    Physically abused: Not on file    Forced sexual activity: Not on file  Other Topics Concern  . Not on file  Social History Narrative   Lives in 2 story home with his significant other   Has 2 adult children    Highest level of education: bachelor's degree with some post grad   Works in Engineer, technical sales for [employer]:  VS: BP 138/82   Pulse 68   Temp 98.3 F (36.8 C) (Oral)   Ht 5' 9.5" (1.765 m)   Wt 277 lb 3.2 oz (125.7 kg)   SpO2 97%   BMI 40.35 kg/m  Physical Exam Gen: NAD, alert, cooperative with exam, well-appearing ENT: normal lips, normal nasal mucosa,  Eye: normal EOM, normal conjunctiva and lids CV:  no edema, +2 pedal pulses   Resp: no accessory muscle use, non-labored,  Skin: no rashes, no areas of induration  Neuro: normal tone, normal sensation to touch Psych:  normal insight, alert and oriented MSK:  Left foot: Callus formation under the forefoot. His callus is tender to the touch. No redness or erythema. No drainage. Neurovascular intact  The area was cleaned in a sterile fashion.  A 15 blade scalpel was used to debride the callus.  An 18-gauge needle was also used for removal of the foreign body.  The callus was debrided until vascularization occurred.  Patient tolerated the procedure.  A sterile dressing was applied.   ASSESSMENT & PLAN:   Left foot pain Appears that he had a foreign body that formed the callus on the plantar aspect of his foot.  Does not appear to be infectious. -Foreign body removal today. -Counseled on dressing and changes. -If pain occurs or returns will need to follow-up for continued exploration of the area.  May need to get imaging.

## 2018-08-29 MED FILL — FUROSEMIDE 20 MG TABS: 20 | 90 days supply | Qty: 90 | Fill #1

## 2018-09-08 DIAGNOSIS — G4733 Obstructive sleep apnea (adult) (pediatric): Secondary | ICD-10-CM | POA: Diagnosis not present

## 2018-10-03 DIAGNOSIS — H43812 Vitreous degeneration, left eye: Secondary | ICD-10-CM | POA: Diagnosis not present

## 2018-10-17 DIAGNOSIS — H43812 Vitreous degeneration, left eye: Secondary | ICD-10-CM | POA: Diagnosis not present

## 2018-10-21 ENCOUNTER — Other Ambulatory Visit: Payer: Self-pay | Admitting: Family Medicine

## 2018-10-21 MED FILL — SIMVASTATIN 40 MG TABLET: 40 | 90 days supply | Qty: 90 | Fill #1

## 2018-12-01 ENCOUNTER — Ambulatory Visit: Payer: 59 | Admitting: Family Medicine

## 2018-12-06 ENCOUNTER — Other Ambulatory Visit: Payer: Self-pay | Admitting: Family Medicine

## 2018-12-06 MED FILL — FUROSEMIDE 20 MG TABS: 20 | 90 days supply | Qty: 90 | Fill #0

## 2018-12-08 DIAGNOSIS — G4733 Obstructive sleep apnea (adult) (pediatric): Secondary | ICD-10-CM | POA: Diagnosis not present

## 2018-12-22 ENCOUNTER — Encounter: Payer: 59 | Admitting: Family Medicine

## 2019-01-12 ENCOUNTER — Encounter: Payer: Self-pay | Admitting: *Deleted

## 2019-01-31 ENCOUNTER — Ambulatory Visit: Payer: 59 | Admitting: Family Medicine

## 2019-02-03 DIAGNOSIS — Z03818 Encounter for observation for suspected exposure to other biological agents ruled out: Secondary | ICD-10-CM | POA: Diagnosis not present

## 2019-02-03 DIAGNOSIS — Z20828 Contact with and (suspected) exposure to other viral communicable diseases: Secondary | ICD-10-CM | POA: Diagnosis not present

## 2019-02-20 ENCOUNTER — Telehealth: Payer: Self-pay

## 2019-02-20 NOTE — Telephone Encounter (Signed)
Questions for Screening COVID-19  Symptom onset: none Prescreen, #2046 arrival   Travel or Contacts: none  During this illness, did/does the patient experience any of the following symptoms? Fever >100.92F []   Yes [x]   No []   Unknown Subjective fever (felt feverish) []   Yes [x]   No []   Unknown Chills []   Yes [x]   No []   Unknown Muscle aches (myalgia) []   Yes [x]   No []   Unknown Runny nose (rhinorrhea) []   Yes [x]   No []   Unknown Sore throat []   Yes [x]   No []   Unknown Cough (new onset or worsening of chronic cough) []   Yes [x]   No []   Unknown Shortness of breath (dyspnea) []   Yes [x]   No []   Unknown Nausea or vomiting []   Yes []   No [x]   Unknown Headache []   Yes []   No [x]   Unknown Abdominal pain  []   Yes [x]   No []   Unknown Diarrhea (?3 loose/looser than normal stools/24hr period) []   Yes [x]   No []   Unknown Other, specify:  Patient risk factors: Smoker? []   Current []   Former [x]   Never If male, currently pregnant? []   Yes [x]   No  Patient Active Problem List   Diagnosis Date Noted  . Left foot pain 08/23/2018  . Fatty liver 07/01/2017  . Hyperglycemia 07/01/2017  . Osteoarthritis 07/29/2016  . Gastroesophageal reflux disease 04/27/2016  . Erectile dysfunction 12/26/2015  . Oral lesion 12/26/2015  . Bilateral knee pain 08/22/2013  . Hyperlipemia 08/07/2010  . Morbid obesity ([place]) 11/30/2008  . Obstructive sleep apnea 11/30/2008  . Essential hypertension 12/16/2006    Plan:  []   High risk for COVID-19 with red flags go to ED (with CP, SOB, weak/lightheaded, or fever > 101.5). Call ahead.  []   High risk for COVID-19 but stable. Inform provider and coordinate time for [facility] visit.   [x]   No red flags but URI signs or symptoms okay for [facility] visit.

## 2019-02-21 ENCOUNTER — Other Ambulatory Visit: Payer: Self-pay | Admitting: Family Medicine

## 2019-02-21 ENCOUNTER — Encounter: Payer: Self-pay | Admitting: Family Medicine

## 2019-02-21 ENCOUNTER — Other Ambulatory Visit: Payer: Self-pay

## 2019-02-21 ENCOUNTER — Ambulatory Visit (INDEPENDENT_AMBULATORY_CARE_PROVIDER_SITE_OTHER): Payer: 59 | Admitting: Family Medicine

## 2019-02-21 VITALS — BP 142/88 | HR 65 | Temp 98.0°F | Resp 18 | Ht 69.0 in | Wt 280.4 lb

## 2019-02-21 DIAGNOSIS — E785 Hyperlipidemia, unspecified: Secondary | ICD-10-CM | POA: Diagnosis not present

## 2019-02-21 DIAGNOSIS — K760 Fatty (change of) liver, not elsewhere classified: Secondary | ICD-10-CM | POA: Diagnosis not present

## 2019-02-21 DIAGNOSIS — W57XXXA Bitten or stung by nonvenomous insect and other nonvenomous arthropods, initial encounter: Secondary | ICD-10-CM | POA: Diagnosis not present

## 2019-02-21 DIAGNOSIS — G4733 Obstructive sleep apnea (adult) (pediatric): Secondary | ICD-10-CM | POA: Diagnosis not present

## 2019-02-21 DIAGNOSIS — I10 Essential (primary) hypertension: Secondary | ICD-10-CM

## 2019-02-21 DIAGNOSIS — R739 Hyperglycemia, unspecified: Secondary | ICD-10-CM | POA: Diagnosis not present

## 2019-02-21 DIAGNOSIS — Z0000 Encounter for general adult medical examination without abnormal findings: Secondary | ICD-10-CM | POA: Diagnosis not present

## 2019-02-21 DIAGNOSIS — Z125 Encounter for screening for malignant neoplasm of prostate: Secondary | ICD-10-CM | POA: Insufficient documentation

## 2019-02-21 DIAGNOSIS — Z7689 Persons encountering health services in other specified circumstances: Secondary | ICD-10-CM | POA: Diagnosis not present

## 2019-02-21 LAB — POCT URINALYSIS DIPSTICK
Bilirubin, UA: NEGATIVE
Blood, UA: NEGATIVE
Glucose, UA: NEGATIVE
Ketones, UA: NEGATIVE
Leukocytes, UA: NEGATIVE
Nitrite, UA: NEGATIVE
Protein, UA: NEGATIVE
Spec Grav, UA: 1.01 (ref 1.010–1.025)
Urobilinogen, UA: 0.2 E.U./dL
pH, UA: 8.5 — AB (ref 5.0–8.0)

## 2019-02-21 MED FILL — SIMVASTATIN 40 MG TABLET: 40 | 90 days supply | Qty: 90 | Fill #0

## 2019-02-21 NOTE — Assessment & Plan Note (Signed)
-  Stable, continue current medications.  

## 2019-02-21 NOTE — Patient Instructions (Signed)
Preventive Care 57-57 Years Old, Male Preventive care refers to lifestyle choices and visits with your health care provider that can promote health and wellness. This includes:  A yearly physical exam. This is also called an annual well check.  Regular dental and eye exams.  Immunizations.  Screening for certain conditions.  Healthy lifestyle choices, such as eating a healthy diet, getting regular exercise, not using drugs or products that contain nicotine and tobacco, and limiting alcohol use. What can I expect for my preventive care visit? Physical exam Your health care provider will check:  Height and weight. These may be used to calculate body mass index (BMI), which is a measurement that tells if you are at a healthy weight.  Heart rate and blood pressure.  Your skin for abnormal spots. Counseling Your health care provider may ask you questions about:  Alcohol, tobacco, and drug use.  Emotional well-being.  Home and relationship well-being.  Sexual activity.  Eating habits.  Work and work environment. What immunizations do I need?  Influenza (flu) vaccine  This is recommended every year. Tetanus, diphtheria, and pertussis (Tdap) vaccine  You may need a Td booster every 10 years. Varicella (chickenpox) vaccine  You may need this vaccine if you have not already been vaccinated. Zoster (shingles) vaccine  You may need this after age 57. Measles, mumps, and rubella (MMR) vaccine  You may need at least one dose of MMR if you were born in 1957 or later. You may also need a second dose. Pneumococcal conjugate (PCV13) vaccine  You may need this if you have certain conditions and were not previously vaccinated. Pneumococcal polysaccharide (PPSV23) vaccine  You may need one or two doses if you smoke cigarettes or if you have certain conditions. Meningococcal conjugate (MenACWY) vaccine  You may need this if you have certain conditions. Hepatitis A vaccine   You may need this if you have certain conditions or if you travel or work in places where you may be exposed to hepatitis A. Hepatitis B vaccine  You may need this if you have certain conditions or if you travel or work in places where you may be exposed to hepatitis B. Haemophilus influenzae type b (Hib) vaccine  You may need this if you have certain risk factors. Human papillomavirus (HPV) vaccine  If recommended by your health care provider, you may need three doses over 6 months. You may receive vaccines as individual doses or as more than one vaccine together in one shot (combination vaccines). Talk with your health care provider about the risks and benefits of combination vaccines. What tests do I need? Blood tests  Lipid and cholesterol levels. These may be checked every 5 years, or more frequently if you are over 50 years old.  Hepatitis C test.  Hepatitis B test. Screening  Lung cancer screening. You may have this screening every year starting at age 57 if you have a 30-pack-year history of smoking and currently smoke or have quit within the past 15 years.  Prostate cancer screening. Recommendations will vary depending on your family history and other risks.  Colorectal cancer screening. All adults should have this screening starting at age 57 and continuing until age 75. Your health care provider may recommend screening at age 45 if you are at increased risk. You will have tests every 1-10 years, depending on your results and the type of screening test.  Diabetes screening. This is done by checking your blood sugar (glucose) after you have not eaten   for a while (fasting). You may have this done every 1-3 years.  Sexually transmitted disease (STD) testing. Follow these instructions at home: Eating and drinking  Eat a diet that includes fresh fruits and vegetables, whole grains, lean protein, and low-fat dairy products.  Take vitamin and mineral supplements as recommended  by your health care provider.  Do not drink alcohol if your health care provider tells you not to drink.  If you drink alcohol: ? Limit how much you have to 0-2 drinks a day. ? Be aware of how much alcohol is in your drink. In the [place] one drink equals one 12 oz bottle of beer (355 mL), one 5 oz glass of wine (148 mL), or one 1 oz glass of hard liquor (44 mL). Lifestyle  Take daily care of your teeth and gums.  Stay active. Exercise for at least 30 minutes on 5 or more days each week.  Do not use any products that contain nicotine or tobacco, such as cigarettes, e-cigarettes, and chewing tobacco. If you need help quitting, ask your health care provider.  If you are sexually active, practice safe sex. Use a condom or other form of protection to prevent STIs (sexually transmitted infections).  Talk with your health care provider about taking a low-dose aspirin every day starting at age 57. What's next?  Go to your health care provider once a year for a well check visit.  Ask your health care provider how often you should have your eyes and teeth checked.  Stay up to date on all vaccines. This information is not intended to replace advice given to you by your health care provider. Make sure you discuss any questions you have with your health care provider. Document Released: 08/09/2015 Document Revised: 07/07/2018 Document Reviewed: 07/07/2018 Elsevier Patient Education  2020 [employer].

## 2019-02-21 NOTE — Assessment & Plan Note (Signed)
-  Some increased straining, check psa and ua today.

## 2019-02-21 NOTE — Assessment & Plan Note (Signed)
-  Discussed that I think Lyme is unlikely cause of his joint pain but given known tick bit with pet dx with lyme disease at the same time, will check antibodies.

## 2019-02-21 NOTE — Assessment & Plan Note (Signed)
Compliant with CPAP, continue.  

## 2019-02-21 NOTE — Assessment & Plan Note (Signed)
Well adult Orders Placed This Encounter  Procedures  . Comp Met (CMET)  . CBC  . Lipid panel  . TSH  . PSA  . Hemoglobin A1c  . Lyme Ab/Western Blot Reflex  . POCT Urinalysis Dipstick  Screening: PSA Immunizations: UTD Anticipatory guidance/risk factor reduction:  Recommendations per AVS

## 2019-02-21 NOTE — Progress Notes (Signed)
Travis Martinez - 58 y.o. male MRN [id number]  Date of birth: March 30, 1961  Subjective Chief Complaint  Patient presents with  . Annual Exam    NP/est care /CPE/Labs- Prostate concers-forcefull urination/FaHx Dm     HPI Travis Martinez is a 58 y.o. male with history of HTN, HLD, OSA, and prediabetes here today for annual exam.  He has concern today of some increased difficulty with urination.  He reports that he has to use more force to start his stream.  No issues once he starts.  He denies significant nocturia or pain with urination.  He also has concern about previous tick bite.  Reports that he and his dog received a tick bite around the same time and dog diagnosed with Lyme disease.  He reports increased joint pain in ankles and knees and some increased fatigue since that time.  He denies fever or chills.    BP has been well controlled with furosemide.  Losartan listed but he doesn't recall ever taking this.    HLD managed with simvastatin.  Tolerating well.    Compliant with cpap for OSA.   He is a non-smoker and works in Engineer, technical sales for [employer].   Review of Systems  Constitutional: Negative for chills, fever, malaise/fatigue and weight loss.  HENT: Negative for congestion, ear pain and sore throat.   Eyes: Negative for blurred vision, double vision and pain.  Respiratory: Negative for cough and shortness of breath.   Cardiovascular: Negative for chest pain and palpitations.  Gastrointestinal: Negative for abdominal pain, blood in stool, constipation, heartburn and nausea.  Genitourinary: Negative for dysuria and urgency.  Musculoskeletal: Positive for joint pain. Negative for myalgias.  Neurological: Negative for dizziness and headaches.  Endo/Heme/Allergies: Does not bruise/bleed easily.  Psychiatric/Behavioral: Negative for depression. The patient is not nervous/anxious and does not have insomnia.     No Known Allergies  Past Medical History:  Diagnosis Date  . Abnormal glucose    . Arthritis   . Cataract    surgically removed right eye   . ED (erectile dysfunction) of organic origin   . Glucose intolerance (malabsorption)   . Hyperlipidemia   . Hypertension   . Obesity   . PVC (premature ventricular contraction)   . Sleep apnea    reports used CPAP in the past, was loosing weight  . Umbilical hernia    ventral hernia, following GB surgery    Past Surgical History:  Procedure Laterality Date  . APPENDECTOMY    . CATARACT EXTRACTION    . HERNIA REPAIR    . mouth growth removal   03/16/2016  . TONSILLECTOMY      Social History   Socioeconomic History  . Marital status: Significant Other    Spouse name: Not on file  . Number of children: 2  . Years of education: Not on file  . Highest education level: Not on file  Occupational History  . Occupation: [employer]  Social Needs  . Financial resource strain: Not hard at all  . Food insecurity    Worry: Never true    Inability: Never true  . Transportation needs    Medical: No    Non-medical: No  Tobacco Use  . Smoking status: Never Smoker  . Smokeless tobacco: Never Used  Substance and Sexual Activity  . Alcohol use: Yes    Alcohol/week: 2.0 - 6.0 standard drinks    Types: 2 - 6 Cans of beer per week    Comment: socially   .  Drug use: No  . Sexual activity: Not on file  Lifestyle  . Physical activity    Days per week: Not on file    Minutes per session: Not on file  . Stress: Only a little  Relationships  . Social Herbalist on phone: Three times a week    Gets together: Three times a week    Attends religious service: Not on file    Active member of club or organization: Not on file    Attends meetings of clubs or organizations: Not on file    Relationship status: Not on file  Other Topics Concern  . 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 Bipolar disorder Mother   . Arthritis Mother   . Colon polyps Mother   . Hyperlipidemia Mother   . Hypertension Mother   . Colon polyps Father   . Hyperlipidemia Father   . Hypertension Father   . Intracerebral hemorrhage Other   . Heart attack Maternal Grandfather 50  . Colon cancer Neg Hx   . Esophageal cancer Neg Hx   . Rectal cancer Neg Hx   . Stomach cancer Neg Hx     Health Maintenance  Topic Date Due  . INFLUENZA VACCINE  02/25/2019  . COLONOSCOPY  03/24/2019  . TETANUS/TDAP  03/19/2027  . Hepatitis C Screening  Completed  . HIV Screening  Completed    ----------------------------------------------------------------------------------------------------------------------------------------------------------------------------------------------------------------- Physical Exam BP (!) 142/88   Pulse 65   Temp 98 F (36.7 C) (Oral)   Resp 18   Ht _0  (1.753 m)   Wt 280 lb 6.4 oz (127.2 kg)   SpO2 96%   BMI 41.41 kg/m   Physical Exam Constitutional:      General: He is not in acute distress. HENT:     Head: Normocephalic and atraumatic.     Right Ear: External ear normal.     Left Ear: External ear normal.  Eyes:     General: No scleral icterus. Neck:     Musculoskeletal: Normal range of motion.     Thyroid: No thyromegaly.  Cardiovascular:     Rate and Rhythm: Normal rate and regular rhythm.     Heart sounds: Normal heart sounds.  Pulmonary:     Effort: Pulmonary effort is normal.     Breath sounds: Normal breath sounds.  Abdominal:     General: Bowel sounds are normal. There is no distension.     Palpations: Abdomen is soft.     Tenderness: There is no abdominal tenderness. There is no guarding.  Lymphadenopathy:     Cervical: No cervical adenopathy.  Skin:    General: Skin is warm and dry.     Findings: No rash.  Neurological:     Mental Status: He is  alert and oriented to person, place, and time.     Cranial Nerves: No cranial nerve deficit.     Motor: No abnormal muscle tone.  Psychiatric:        Behavior: Behavior normal.     ------------------------------------------------------------------------------------------------------------------------------------------------------------------------------------------------------------------- Assessment and Plan  Well adult exam Well adult Orders Placed This Encounter  Procedures  . Comp Met (CMET)  . CBC  .  Lipid panel  . TSH  . PSA  . Hemoglobin A1c  . Lyme Ab/Western Blot Reflex  . POCT Urinalysis Dipstick  Screening: PSA Immunizations: UTD Anticipatory guidance/risk factor reduction:  Recommendations per AVS  Tick bite -Discussed that I think Lyme is unlikely cause of his joint pain but given known tick bit with pet dx with lyme disease at the same time, will check antibodies.   Screening for prostate cancer -Some increased straining, check psa and ua today.   Obstructive sleep apnea Compliant with CPAP, continue.   Essential hypertension -Stable, continue current medications.

## 2019-02-22 LAB — HEMOGLOBIN A1C
Hgb A1c MFr Bld: 6.2 % of total Hgb — ABNORMAL HIGH (ref <5.7)
Mean Plasma Glucose: 131 (calc)
eAG (mmol/L): 7.3 (calc)

## 2019-02-22 LAB — COMPREHENSIVE METABOLIC PANEL
AG Ratio: 1.8 (calc) (ref 1.0–2.5)
ALT: 30 U/L (ref 9–46)
AST: 22 U/L (ref 10–35)
Albumin: 4.5 g/dL (ref 3.6–5.1)
Alkaline phosphatase (APISO): 55 U/L (ref 35–144)
BUN: 16 mg/dL (ref 7–25)
CO2: 21 mmol/L (ref 20–32)
Calcium: 9.5 mg/dL (ref 8.6–10.3)
Chloride: 104 mmol/L (ref 98–110)
Creat: 0.98 mg/dL (ref 0.70–1.33)
Globulin: 2.5 g/dL (calc) (ref 1.9–3.7)
Glucose, Bld: 108 mg/dL — ABNORMAL HIGH (ref 65–99)
Potassium: 4.2 mmol/L (ref 3.5–5.3)
Sodium: 138 mmol/L (ref 135–146)
Total Bilirubin: 0.6 mg/dL (ref 0.2–1.2)
Total Protein: 7 g/dL (ref 6.1–8.1)

## 2019-02-22 LAB — CBC
HCT: 47 % (ref 38.5–50.0)
Hemoglobin: 16 g/dL (ref 13.2–17.1)
MCH: 30.4 pg (ref 27.0–33.0)
MCHC: 34 g/dL (ref 32.0–36.0)
MCV: 89.2 fL (ref 80.0–100.0)
MPV: 14.1 fL — ABNORMAL HIGH (ref 7.5–12.5)
Platelets: 143 10*3/uL (ref 140–400)
RBC: 5.27 10*6/uL (ref 4.20–5.80)
RDW: 13 % (ref 11.0–15.0)
WBC: 5.1 10*3/uL (ref 3.8–10.8)

## 2019-02-22 LAB — PSA: PSA: 3.4 ng/mL (ref <=4.0)

## 2019-02-22 LAB — LYME AB/WESTERN BLOT REFLEX
LYME DISEASE AB, QUANT, IGM: <0.8 index (ref 0.00–0.79)
Lyme IgG/IgM Ab: <0.91 {ISR} (ref 0.00–0.90)

## 2019-02-22 LAB — LIPID PANEL
Cholesterol: 190 mg/dL (ref <200)
HDL: 35 mg/dL — ABNORMAL LOW (ref >=40)
LDL Cholesterol (Calc): 115 mg/dL (calc) — ABNORMAL HIGH
Non-HDL Cholesterol (Calc): 155 mg/dL (calc) — ABNORMAL HIGH (ref <130)
Total CHOL/HDL Ratio: 5.4 (calc) — ABNORMAL HIGH (ref <5.0)
Triglycerides: 298 mg/dL — ABNORMAL HIGH (ref <150)

## 2019-02-22 LAB — TSH: TSH: 3.87 mIU/L (ref 0.40–4.50)

## 2019-02-25 ENCOUNTER — Encounter: Payer: Self-pay | Admitting: Internal Medicine

## 2019-02-27 ENCOUNTER — Encounter: Payer: Self-pay | Admitting: Family Medicine

## 2019-03-16 ENCOUNTER — Encounter: Payer: Self-pay | Admitting: Family Medicine

## 2019-03-16 MED FILL — OMRON 3 SERIES BP MONITOR D: 20 days supply | Qty: 1 | Fill #0

## 2019-03-17 ENCOUNTER — Telehealth: Payer: Self-pay | Admitting: *Deleted

## 2019-03-17 NOTE — Telephone Encounter (Signed)

## 2019-03-20 ENCOUNTER — Ambulatory Visit: Payer: 59 | Admitting: Cardiovascular Disease

## 2019-03-20 ENCOUNTER — Encounter: Payer: Self-pay | Admitting: Cardiovascular Disease

## 2019-03-20 ENCOUNTER — Other Ambulatory Visit: Payer: Self-pay

## 2019-03-20 VITALS — BP 122/86 | HR 61 | Ht 69.0 in | Wt 283.4 lb

## 2019-03-20 DIAGNOSIS — I77810 Thoracic aortic ectasia: Secondary | ICD-10-CM | POA: Diagnosis not present

## 2019-03-20 DIAGNOSIS — I712 Thoracic aortic aneurysm, without rupture, unspecified: Secondary | ICD-10-CM

## 2019-03-20 LAB — BASIC METABOLIC PANEL
BUN/Creatinine Ratio: 26 — ABNORMAL HIGH (ref 9–20)
BUN: 32 mg/dL — ABNORMAL HIGH (ref 6–24)
CO2: 21 mmol/L (ref 20–29)
Calcium: 9.4 mg/dL (ref 8.7–10.2)
Chloride: 107 mmol/L — ABNORMAL HIGH (ref 96–106)
Creatinine, Ser: 1.22 mg/dL (ref 0.76–1.27)
GFR calc Af Amer: 76 mL/min/{1.73_m2} (ref >59)
GFR calc non Af Amer: 65 mL/min/{1.73_m2} (ref >59)
Glucose: 138 mg/dL — ABNORMAL HIGH (ref 65–99)
Potassium: 4.6 mmol/L (ref 3.5–5.2)
Sodium: 143 mmol/L (ref 134–144)

## 2019-03-20 NOTE — Progress Notes (Signed)
Chief Complaint  Patient presents with  . Follow-up    HTN   History of Present Illness: 58 yo male with history of HLD, HTN, PVCs and sleep apnea here today for follow up. I saw him in 2012 for evaluation of chest pain and for further workup of PVCs noted on cardiac monitoring. Nuclear stress test in 2012 with no ischemia. Echo in 2012 with normal LV systolic function, no significant valve disease. Chest pain in the past improved with PPI. He was seen in the ED 03/17/17 with blurry vision. EKG showed sinus rhythm, PVCs. I last saw him in 2018 and he described lower extremity edema that was controlled with Lasix. He had no other complaints. Echo September 2018 with LVEF=60-65% with LVH. Exercise stress test in September 2018 with no ischemia. Chest CTA September 2018 with 4.3 cm aortic at the sinuses of valsalva.   He is here today for follow up. The patient denies any chest pain, dyspnea, palpitations, lower extremity edema, orthopnea, PND, dizziness, near syncope or syncope.   Primary Care Physician: Luetta Nutting, DO   Past Medical History:  Diagnosis Date  . Abnormal glucose   . Arthritis   . Cataract    surgically removed right eye   . ED (erectile dysfunction) of organic origin   . Glucose intolerance (malabsorption)   . Hyperlipidemia   . Hypertension   . Obesity   . PVC (premature ventricular contraction)   . Sleep apnea    reports used CPAP in the past, was loosing weight  . Umbilical hernia    ventral hernia, following GB surgery    Past Surgical History:  Procedure Laterality Date  . APPENDECTOMY    . CATARACT EXTRACTION    . HERNIA REPAIR    . mouth growth removal   03/16/2016  . TONSILLECTOMY      Current Outpatient Medications  Medication Sig Dispense Refill  . aspirin 81 MG tablet Take 81 mg by mouth daily.      . cyanocobalamin 500 MCG tablet Take 500 mcg by mouth daily.    . furosemide (LASIX) 20 MG tablet TAKE 1 TABLET BY MOUTH ONCE DAILY 90 tablet 0   . Omega-3 Fatty Acids (FISH OIL PO) Take 1,000 mg by mouth daily.    . simvastatin (ZOCOR) 40 MG tablet TAKE 1 TABLET BY MOUTH AT BEDTIME 90 tablet 0   Current Facility-Administered Medications  Medication Dose Route Frequency Provider Last Rate Last Dose  . 0.9 %  sodium chloride infusion  500 mL Intravenous Continuous Pyrtle, Lajuan Lines, MD        No Known Allergies  Social History   Socioeconomic History  . Marital status: Significant Other    Spouse name: Not on file  . Number of children: 2  . Years of education: Not on file  . Highest education level: Not on file  Occupational History  . Occupation: [employer]  Social Needs  . Financial resource strain: Not hard at all  . Food insecurity    Worry: Never true    Inability: Never true  . Transportation needs    Medical: No    Non-medical: No  Tobacco Use  . Smoking status: Never Smoker  . Smokeless tobacco: Never Used  Substance and Sexual Activity  . Alcohol use: Yes    Alcohol/week: 2.0 - 6.0 standard drinks    Types: 2 - 6 Cans of beer per week    Comment: socially   . Drug use: No  .  Sexual activity: Not on file  Lifestyle  . Physical activity    Days per week: Not on file    Minutes per session: Not on file  . Stress: Only a little  Relationships  . Social Herbalist on phone: Three times a week    Gets together: Three times a week    Attends religious service: Not on file    Active member of club or organization: Not on file    Attends meetings of clubs or organizations: Not on file    Relationship status: Not on file  . Intimate partner violence    Fear of current or ex partner: No    Emotionally abused: No    Physically abused: No    Forced sexual activity: Not on file  Other Topics Concern  . Not on file  Social History Narrative   Lives in 2 story home with his significant other   Has 2 adult children   Highest level of education: bachelor's degree with some post grad   Works in Engineer, technical sales for  [employer]     Family History  Problem Relation Age of Onset  . Bipolar disorder Mother   . Arthritis Mother   . Colon polyps Mother   . Hyperlipidemia Mother   . Hypertension Mother   . Colon polyps Father   . Hyperlipidemia Father   . Hypertension Father   . Intracerebral hemorrhage Other   . Heart attack Maternal Grandfather 50  . Colon cancer Neg Hx   . Esophageal cancer Neg Hx   . Rectal cancer Neg Hx   . Stomach cancer Neg Hx     Review of Systems:  As stated in the HPI and otherwise negative.   BP 122/86   Pulse 61   Ht 5\' 9"  (1.753 m)   Wt 283 lb 6.4 oz (128.5 kg)   SpO2 96%   BMI 41.85 kg/m   Physical Examination: General: Well developed, well nourished, NAD  HEENT: OP clear, mucus membranes moist  SKIN: warm, dry. No rashes. Neuro: No focal deficits  Musculoskeletal: Muscle strength 5/5 all ext  Psychiatric: Mood and affect normal  Neck: No JVD, no carotid bruits, no thyromegaly, no lymphadenopathy.  Lungs:Clear bilaterally, no wheezes, rhonci, crackles Cardiovascular: Regular rate and rhythm. No murmurs, gallops or rubs. Abdomen:Soft. Bowel sounds present. Non-tender.  Extremities: No lower extremity edema. Pulses are 2 + in the bilateral DP/PT.  EKG:  EKG is ordered today. The ekg ordered today demonstrates NSR, rate 61 bpm.   Echo September 2018: - Left ventricle: The cavity size was normal. There was moderate   concentric hypertrophy. Systolic function was normal. The   estimated ejection fraction was in the range of 60% to 65%. Wall   motion was normal; there were no regional wall motion   abnormalities. Left ventricular diastolic function parameters   were normal. - Aorta: Aortic root dimension: 41 mm (ED). - Aortic root: The aortic root was mildly dilated. - Pulmonic valve: There was trivial regurgitation. - Impressions: Normal LVF, moderate LVH, trivial PR and normal   diastolic function and mildly dilated aortic root at 4.1cm.   Recent  Labs: 02/21/2019: ALT 30; BUN 16; Creat 0.98; Hemoglobin 16.0; Platelets 143; Potassium 4.2; Sodium 138; TSH 3.87   Lipid Panel    Component Value Date/Time   CHOL 190 02/21/2019 1134   TRIG 298 (H) 02/21/2019 1134   HDL 35 (L) 02/21/2019 1134   CHOLHDL 5.4 (H) 02/21/2019  1134   VLDL 58.4 (H) 11/30/2017 1641   LDLCALC 115 (H) 02/21/2019 1134   LDLDIRECT 128.0 11/30/2017 1641     Wt Readings from Last 3 Encounters:  03/20/19 283 lb 6.4 oz (128.5 kg)  02/21/19 280 lb 6.4 oz (127.2 kg)  08/22/18 277 lb 3.2 oz (125.7 kg)     Other studies Reviewed: Additional studies/ records that were reviewed today include:  Review of the above records demonstrates:   Assessment and Plan:   1. Chest pain:  No recent pain. Normal stress test in 2018. EKG normal today.   2. Thoracic aortic aneurysm: Will repeat chest CTA   Current medicines are reviewed at length with the patient today.  The patient does not have concerns regarding medicines.  The following changes have been made:  no change  Labs/ tests ordered today include:   Orders Placed This Encounter  Procedures  . CT ANGIO CHEST AORTA W &/OR WO CONTRAST  . Basic metabolic panel  . EKG 12-Lead     Disposition:   FU with me in 24 months   Signed, Lauree Chandler, MD 03/20/2019 10:24 AM    [facility] [address], [place], [place]  [address] Phone: [phone]; Fax: [phone]

## 2019-03-20 NOTE — Patient Instructions (Addendum)
Medication Instructions:  Your physician recommends that you continue on your current medications as directed. Please refer to the Current Medication list given to you today.  If you need a refill on your cardiac medications before your next appointment, please call your pharmacy.   Lab work: TODAY: BMET  If you have labs (blood work) drawn today and your tests are completely normal, you will receive your results only by: Marland Kitchen MyChart Message (if you have MyChart) OR . A paper copy in the mail If you have any lab test that is abnormal or we need to change your treatment, we will call you to review the results.  Testing/Procedures: Your physician recommends that you have a CT Chest Aorta to evaluate your thoracic aortic aneurysm   Follow-Up: At [facility], you and your health needs are our priority.  As part of our continuing mission to provide you with exceptional heart care, we have created designated Provider Care Teams.  These Care Teams include your primary Cardiologist (physician) and Advanced Practice Providers (APPs -  Physician Assistants and Nurse Practitioners) who all work together to provide you with the care you need, when you need it. . You will need a follow up appointment in 2 years.  Please call our office 2 months in advance to schedule this appointment.  You may see Darlina Guys, MD or one of the following Advanced Practice Providers on your designated Care Team:   . Lyda Jester, PA-C . Dayna Dunn, PA-C . Ermalinda Barrios, PA-C  Any Other Special Instructions Will Be Listed Below (If Applicable).

## 2019-04-05 ENCOUNTER — Other Ambulatory Visit: Payer: Self-pay

## 2019-04-05 MED ORDER — FUROSEMIDE 20 MG PO TABS: 20.0000 mg | ORAL_TABLET | Freq: Every day | ORAL | 0 refills | Status: DC

## 2019-04-05 MED FILL — FUROSEMIDE 20 MG TABS: 20 | 90 days supply | Qty: 90 | Fill #0

## 2019-04-10 ENCOUNTER — Ambulatory Visit (INDEPENDENT_AMBULATORY_CARE_PROVIDER_SITE_OTHER): Payer: 59 | Admitting: Registered Nurse

## 2019-04-10 ENCOUNTER — Ambulatory Visit (INDEPENDENT_AMBULATORY_CARE_PROVIDER_SITE_OTHER)
Admission: RE | Admit: 2019-04-10 | Discharge: 2019-04-10 | Disposition: A | Discharge status: Home / Self Care | Payer: 59 | Source: Ambulatory Visit | Attending: Cardiovascular Disease | Admitting: Cardiovascular Disease

## 2019-04-10 ENCOUNTER — Other Ambulatory Visit: Payer: Self-pay

## 2019-04-10 DIAGNOSIS — I712 Thoracic aortic aneurysm, without rupture, unspecified: Secondary | ICD-10-CM

## 2019-04-10 DIAGNOSIS — Z23 Encounter for immunization: Secondary | ICD-10-CM | POA: Diagnosis not present

## 2019-04-10 DIAGNOSIS — Z8679 Personal history of other diseases of the circulatory system: Secondary | ICD-10-CM | POA: Diagnosis not present

## 2019-04-10 MED ORDER — IOHEXOL 300 MG/ML  SOLN
100.0000 mL | Freq: Once | INTRAMUSCULAR | Status: AC | PRN
  Administered 2019-04-10: 100 mL via INTRAVENOUS

## 2019-04-10 NOTE — Patient Instructions (Signed)
Injection given in the right arm, tolerated well.

## 2019-05-16 DIAGNOSIS — G4733 Obstructive sleep apnea (adult) (pediatric): Secondary | ICD-10-CM | POA: Diagnosis not present

## 2019-05-26 ENCOUNTER — Encounter: Payer: Self-pay | Admitting: Family Medicine

## 2019-05-26 ENCOUNTER — Telehealth (INDEPENDENT_AMBULATORY_CARE_PROVIDER_SITE_OTHER): Payer: 59 | Admitting: Family Medicine

## 2019-05-26 DIAGNOSIS — M109 Gout, unspecified: Secondary | ICD-10-CM | POA: Diagnosis not present

## 2019-05-26 MED ORDER — TRAMADOL HCL 50 MG PO TABS: 50.0000 mg | ORAL_TABLET | Freq: Three times a day (TID) | ORAL | 0 refills | Status: AC | PRN

## 2019-05-26 MED ORDER — COLCHICINE 0.6 MG PO TABS: ORAL_TABLET | ORAL | 0 refills | Status: DC

## 2019-05-26 NOTE — Progress Notes (Signed)
Travis Martinez - 58 y.o. male MRN [id number]  Date of birth: 31-Aug-1960   This visit type was conducted due to national recommendations for restrictions regarding the COVID-19 Pandemic (e.g. social distancing).  This format is felt to be most appropriate for this patient at this time.  All issues noted in this document were discussed and addressed.  No physical exam was performed (except for noted visual exam findings with Video Visits).  I discussed the limitations of evaluation and management by telemedicine and the availability of in person appointments. The patient expressed understanding and agreed to proceed.  I connected with@ on 05/26/19 at 10:40 AM EDT by a video enabled telemedicine application and verified that I am speaking with the correct person using two identifiers.  Present for visit:   Travis Martinez   Patient Location: Home [place] [address]   Provider location:   [facility]  Chief Complaint  Patient presents with  . Gout    Gout flair up for about 2-3 months patient said last 3 days he cant sleep and can barely walk because of the pain.    HPI  [name] is a 58 y.o. male who presents via audio/video conferencing for a telehealth visit today.  He reports gout flare in R foot and ankle area.  This feels similar to prior gout flares.  He has been taking aleve without much improvement.  He has difficulty walking and sleeping due to pain.  He denies fever, chills, or injury.  He has avoided any EtOH since this flared up.   ROS:  A comprehensive ROS was completed and negative except as noted per HPI  Past Medical History:  Diagnosis Date  . Abnormal glucose   . Arthritis   . Cataract    surgically removed right eye   . ED (erectile dysfunction) of organic origin   . Glucose intolerance (malabsorption)   . Hyperlipidemia   . Hypertension   . Obesity   . PVC (premature ventricular contraction)   . Sleep apnea    reports used CPAP in the past, was loosing weight  . Umbilical hernia    ventral hernia, following GB surgery    Past Surgical History:  Procedure Laterality Date  . APPENDECTOMY    . CATARACT EXTRACTION    . HERNIA REPAIR    . mouth growth removal   03/16/2016  . TONSILLECTOMY      Family History  Problem Relation Age of Onset  . Bipolar disorder Mother   . Arthritis Mother   . Colon polyps Mother   . Hyperlipidemia Mother   . Hypertension Mother   . Colon polyps Father   . Hyperlipidemia Father   . Hypertension Father   . Intracerebral hemorrhage Other   . Heart attack Maternal Grandfather 50  . Colon cancer Neg Hx   . Esophageal cancer Neg Hx   . Rectal cancer Neg Hx   . Stomach cancer Neg Hx     Social History   Socioeconomic History  . Marital status: Significant Other    Spouse name: Not on file  . Number of children: 2  . Years of education: Not on file  . Highest education level: Not on file  Occupational History  . Occupation: [employer]  Social Needs  . Financial resource strain: Not hard at all  . Food insecurity    Worry: Never true    Inability: Never true  . Transportation needs  Medical: No    Non-medical: No  Tobacco Use  . Smoking status: Never Smoker  . Smokeless tobacco: Never Used  Substance and Sexual Activity  . Alcohol use: Yes    Alcohol/week: 2.0 - 6.0 standard drinks    Types: 2 - 6 Cans of beer per week    Comment: socially   . Drug use: No  . Sexual activity: Not on file  Lifestyle  . Physical activity    Days per week: Not on file    Minutes per session: Not on file  . Stress: Only a little  Relationships  . Social Herbalist on phone: Three times a week    Gets together: Three times a week    Attends religious service: Not on file    Active member of club or organization: Not on file    Attends meetings of clubs or organizations: Not on file    Relationship status: Not on file  . Intimate partner violence     Fear of current or ex partner: No    Emotionally abused: No    Physically abused: No    Forced sexual activity: Not on file  Other Topics Concern  . Not on file  Social History Narrative   Lives in 2 story home with his significant other   Has 2 adult children   Highest level of education: bachelor's degree with some post grad   Works in Engineer, technical sales for [employer]      Current Outpatient Medications:  .  aspirin 81 MG tablet, Take 81 mg by mouth daily.  , Disp: , Rfl:  .  cyanocobalamin 500 MCG tablet, Take 500 mcg by mouth daily., Disp: , Rfl:  .  furosemide (LASIX) 20 MG tablet, Take 1 tablet (20 mg total) by mouth daily., Disp: 90 tablet, Rfl: 0 .  Omega-3 Fatty Acids (FISH OIL PO), Take 1,000 mg by mouth daily., Disp: , Rfl:  .  simvastatin (ZOCOR) 40 MG tablet, TAKE 1 TABLET BY MOUTH AT BEDTIME, Disp: 90 tablet, Rfl: 0 .  colchicine 0.6 MG tablet, Take 1.2mg  initially followed by 0.6mg  1 hour later.  Continue 0.6mg  daily until symptoms resolving., Disp: 30 tablet, Rfl: 0 .  traMADol (ULTRAM) 50 MG tablet, Take 1 tablet (50 mg total) by mouth every 8 (eight) hours as needed for up to 5 days., Disp: 15 tablet, Rfl: 0  Current Facility-Administered Medications:  .  0.9 %  sodium chloride infusion, 500 mL, Intravenous, Continuous, Pyrtle, Lajuan Lines, MD  EXAM:  VITALS per patient if applicable: Ht 5\' 9"  (1.753 m)   BMI 41.85 kg/m   GENERAL: alert, oriented, appears well and in no acute distress  HEENT: atraumatic, conjunttiva clear, no obvious abnormalities on inspection of external nose and ears  NECK: normal movements of the head and neck  LUNGS: on inspection no signs of respiratory distress, breathing rate appears normal, no obvious gross SOB, gasping or wheezing  CV: no obvious cyanosis  MS: moves all visible extremities without noticeable abnormality  PSYCH/NEURO: pleasant and cooperative, no obvious depression or anxiety, speech and thought processing grossly intact   ASSESSMENT AND PLAN:  Discussed the following assessment and plan:  Gout Start colchicine with tramadol as needed for pain control -Discussed diet to reduce uric acid levels.  -Once gout has resolved, will check serum uric acid levels.         I discussed the assessment and treatment plan with the patient. The patient was  provided an opportunity to ask questions and all were answered. The patient agreed with the plan and demonstrated an understanding of the instructions.   The patient was advised to call back or seek an in-person evaluation if the symptoms worsen or if the condition fails to improve as anticipated.    Luetta Nutting, DO

## 2019-05-26 NOTE — Assessment & Plan Note (Signed)
Start colchicine with tramadol as needed for pain control -Discussed diet to reduce uric acid levels.  -Once gout has resolved, will check serum uric acid levels.

## 2019-06-26 ENCOUNTER — Other Ambulatory Visit: Payer: Self-pay

## 2019-06-26 ENCOUNTER — Other Ambulatory Visit: Payer: Self-pay | Admitting: Family Medicine

## 2019-06-26 MED FILL — FUROSEMIDE 20 MG TABS: 20 | 90 days supply | Qty: 90 | Fill #0

## 2019-06-28 MED FILL — SIMVASTATIN 40 MG TABLET: 40 | 90 days supply | Qty: 90 | Fill #0

## 2019-07-11 ENCOUNTER — Other Ambulatory Visit: Payer: Self-pay

## 2019-07-11 ENCOUNTER — Other Ambulatory Visit: Payer: 59

## 2019-07-11 ENCOUNTER — Encounter: Payer: Self-pay | Admitting: Family Medicine

## 2019-07-11 ENCOUNTER — Telehealth (INDEPENDENT_AMBULATORY_CARE_PROVIDER_SITE_OTHER): Payer: 59 | Admitting: Family Medicine

## 2019-07-11 VITALS — Ht 69.0 in

## 2019-07-11 DIAGNOSIS — M109 Gout, unspecified: Secondary | ICD-10-CM

## 2019-07-11 LAB — URIC ACID: Uric Acid, Serum: 6.5 mg/dL (ref 4.0–7.8)

## 2019-07-11 NOTE — Progress Notes (Addendum)
Travis Martinez - 58 y.o. male MRN [id number]  Date of birth: [birth year]   This visit type was conducted due to national recommendations for restrictions regarding the COVID-19 Pandemic (e.g. social distancing).  This format is felt to be most appropriate for this patient at this time.  All issues noted in this document were discussed and addressed.  No physical exam was performed (except for noted visual exam findings with Video Visits).  I discussed the limitations of evaluation and management by telemedicine and the availability of in person appointments. The patient expressed understanding and agreed to proceed.  I connected with@ on 07/11/19 at 10:40 AM EST by a video enabled telemedicine application and verified that I am speaking with the correct person using two identifiers.  Present at visit: Luetta Nutting, DO [name]   Patient Location:Home [place] [address]   Provider location:   [place]  Chief Complaint  Patient presents with  . Gout    Pt wants some meds for his gout now that the flare has cleared. Lab for uric acid test.    HPI  Travis Martinez is a 57 y.o. male who presents via audio/video conferencing for a telehealth visit today.  He is following up today for gout.  He had gout flare about 1.5 months ago which is resolved and we discussed having his uric acid levels checked after flare.  He also would like to discuss starting medication for prevention of gout flares as these have increased in frequency over the past year.     ROS:  A comprehensive ROS was completed and negative except as noted per HPI  Past Medical History:  Diagnosis Date  . Abnormal glucose   . Arthritis   . Cataract    surgically removed right eye   . ED (erectile dysfunction) of organic origin   . Glucose intolerance (malabsorption)   . Hyperlipidemia   . Hypertension   . Obesity   . PVC (premature ventricular contraction)   . Sleep apnea    reports used CPAP in the past, was loosing weight  . Umbilical hernia    ventral hernia, following GB surgery    Past Surgical History:  Procedure Laterality Date  . APPENDECTOMY    . CATARACT EXTRACTION    . HERNIA REPAIR    . mouth growth removal   03/16/2016  . TONSILLECTOMY      Family History  Problem Relation Age of Onset  . Bipolar disorder Mother   . Arthritis Mother   . Colon polyps Mother   . Hyperlipidemia Mother   . Hypertension Mother   . Colon polyps Father   . Hyperlipidemia Father   . Hypertension Father   . Intracerebral hemorrhage Other   . Heart attack Maternal Grandfather 50  . Colon cancer Neg Hx   . Esophageal cancer Neg Hx   . Rectal cancer Neg Hx   . Stomach cancer Neg Hx     Social History   Socioeconomic History  . Marital status: Significant Other    Spouse name: Not on file  . Number of children: 2  . Years of education: Not on file  . Highest education level: Not on file  Occupational History  . Occupation: [employer]  Tobacco Use  . Smoking status: Never Smoker  . Smokeless tobacco: Never Used  Substance and Sexual Activity  . Alcohol use: Yes    Alcohol/week: 2.0 - 6.0 standard drinks  Types: 2 - 6 Cans of beer per week    Comment: socially   . Drug use: No  . Sexual activity: Not on file  Other Topics Concern  . 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 Difficulty of Paying Living Expenses: Not hard at all  Food Insecurity: No Food Insecurity  . Worried About Charity fundraiser in the Last Year: Never true  . Ran Out of Food in the Last Year: Never true  Transportation Needs: No Transportation Needs  . Lack of Transportation (Medical): No  . Lack of Transportation (Non-Medical):  No  Physical Activity:   . Days of Exercise per Week: Not on file  . Minutes of Exercise per Session: Not on file  Stress: No Stress Concern Present  . Feeling of Stress : Only a little  Social Connections: Unknown  . Frequency of Communication with Friends and Family: Three times a week  . Frequency of Social Gatherings with Friends and Family: Three times a week  . Attends Religious Services: Not on file  . Active Member of Clubs or Organizations: Not on file  . Attends Archivist Meetings: Not on file  . Marital Status: Not on file  Intimate Partner Violence: Unknown  . Fear of Current or Ex-Partner: No  . Emotionally Abused: No  . Physically Abused: No  . Sexually Abused: Not on file     Current Outpatient Medications:  .  aspirin 81 MG tablet, Take 81 mg by mouth daily.  , Disp: , Rfl:  .  colchicine 0.6 MG tablet, Take 1.2mg  initially followed by 0.6mg  1 hour later.  Continue 0.6mg  daily until symptoms resolving., Disp: 30 tablet, Rfl: 0 .  cyanocobalamin 500 MCG tablet, Take 500 mcg by mouth daily., Disp: , Rfl:  .  furosemide (LASIX) 20 MG tablet, TAKE 1 TABLET (20 MG TOTAL) BY MOUTH DAILY., Disp: 90 tablet, Rfl: 0 .  Omega-3 Fatty Acids (FISH OIL PO), Take 1,000 mg by mouth daily., Disp: , Rfl:  .  simvastatin (ZOCOR) 40 MG tablet, TAKE 1 TABLET BY MOUTH AT BEDTIME, Disp: 90 tablet, Rfl: 0  Current Facility-Administered Medications:  .  0.9 %  sodium chloride infusion, 500 mL, Intravenous, Continuous, Pyrtle, Lajuan Lines, MD  EXAM:  VITALS per patient if applicable: Ht 5\' 9"  (1.753 m)   BMI 41.85 kg/m   GENERAL: alert, oriented, appears well and in no acute distress  HEENT: atraumatic, conjunttiva clear, no obvious abnormalities on inspection of external nose and ears  NECK: normal movements of the head and neck  LUNGS: on inspection no signs of respiratory distress, breathing rate appears normal, no obvious gross SOB, gasping or wheezing  CV: no obvious  cyanosis  MS: moves all visible extremities without noticeable abnormality  PSYCH/NEURO: pleasant and cooperative, no obvious depression or anxiety, speech and thought processing grossly intact  ASSESSMENT AND PLAN:  Discussed the following assessment and plan:  Gout -Previous flare has resolved.  -Check uric acid, if elevated we discussed starting allopurinol combined with colchicine until uric acid levels have normalized to prevent precipitating a gout flare.  -Reviewed diet and he expresses understanding and agreement of plan.  I discussed the assessment and treatment plan with the patient. The patient was provided an opportunity to ask questions and all were answered. The patient agreed with the plan and demonstrated an understanding of the instructions.   The patient was advised to call back or seek an in-person evaluation if the symptoms worsen or if the condition fails to improve as anticipated.    Luetta Nutting, DO

## 2019-07-11 NOTE — Addendum Note (Signed)
Addended by: Lynnea Ferrier on: 07/11/2019 01:01 PM   Modules accepted: Orders

## 2019-07-11 NOTE — Assessment & Plan Note (Signed)
-  Previous flare has resolved.  -Check uric acid, if elevated we discussed starting allopurinol combined with colchicine until uric acid levels have normalized to prevent precipitating a gout flare.  -Reviewed diet and he expresses understanding and agreement of plan.

## 2019-07-14 ENCOUNTER — Other Ambulatory Visit: Payer: Self-pay | Admitting: Family Medicine

## 2019-07-14 MED ORDER — ALLOPURINOL 100 MG PO TABS: 100.0000 mg | ORAL_TABLET | Freq: Every day | ORAL | 6 refills | Status: AC

## 2019-07-14 MED ORDER — COLCHICINE 0.6 MG PO TABS: 0.6000 mg | ORAL_TABLET | Freq: Every day | ORAL | 0 refills | Status: AC

## 2019-07-14 MED FILL — ALLOPURINOL 100 MG TABS: 100 | 30 days supply | Qty: 30 | Fill #0

## 2019-07-14 MED FILL — COLCHICINE 0.6 MG TABS: 0.6 | 30 days supply | Qty: 30 | Fill #0

## 2019-08-08 DIAGNOSIS — G4733 Obstructive sleep apnea (adult) (pediatric): Secondary | ICD-10-CM | POA: Diagnosis not present

## 2019-08-15 MED FILL — ALLOPURINOL 100 MG TABS: 100 | 90 days supply | Qty: 90 | Fill #1

## 2019-10-03 ENCOUNTER — Other Ambulatory Visit: Payer: Self-pay | Admitting: Family Medicine

## 2019-10-03 MED FILL — SIMVASTATIN 40 MG TABLET: 40 | 90 days supply | Qty: 90 | Fill #0

## 2019-10-16 ENCOUNTER — Encounter: Payer: Self-pay | Admitting: Internal Medicine

## 2019-10-26 ENCOUNTER — Other Ambulatory Visit: Payer: Self-pay | Admitting: Family Medicine

## 2019-10-26 ENCOUNTER — Other Ambulatory Visit: Payer: Self-pay | Admitting: *Deleted

## 2019-10-26 MED ORDER — FUROSEMIDE 20 MG PO TABS: 20.0000 mg | ORAL_TABLET | Freq: Every day | ORAL | 0 refills | Status: DC

## 2019-10-26 MED FILL — FUROSEMIDE 20 MG TABS: 20 | 90 days supply | Qty: 90 | Fill #0

## 2019-10-30 NOTE — Telephone Encounter (Signed)
CM-Plz see refill req/thx dmf 

## 2019-11-01 ENCOUNTER — Encounter: Payer: Self-pay | Admitting: Family Medicine

## 2019-11-02 ENCOUNTER — Other Ambulatory Visit (INDEPENDENT_AMBULATORY_CARE_PROVIDER_SITE_OTHER): Payer: 59 | Admitting: Osteopathic Medicine

## 2019-11-02 DIAGNOSIS — I10 Essential (primary) hypertension: Secondary | ICD-10-CM

## 2019-11-02 DIAGNOSIS — R739 Hyperglycemia, unspecified: Secondary | ICD-10-CM

## 2019-11-02 MED ORDER — VALSARTAN 80 MG PO TABS: 80.0000 mg | ORAL_TABLET | Freq: Every day | ORAL | 1 refills | Status: DC

## 2019-11-02 MED FILL — VALSARTAN 80 MG TABLET: 80 | 30 days supply | Qty: 30 | Fill #0

## 2019-11-02 NOTE — Telephone Encounter (Signed)
Forwarding to covering provider.

## 2019-11-02 NOTE — Progress Notes (Signed)
Chart reviewed Hx Gout, hyperglycemia Will start ARB, avoid thiazide  F/u w/ Dr Zigmund Daniel  5 mins spent

## 2019-11-03 ENCOUNTER — Telehealth: Payer: 59 | Admitting: Nurse Practitioner

## 2019-11-06 DIAGNOSIS — G4733 Obstructive sleep apnea (adult) (pediatric): Secondary | ICD-10-CM | POA: Diagnosis not present

## 2019-11-10 MED FILL — ALLOPURINOL 100 MG TABS: 100 | 90 days supply | Qty: 90 | Fill #2

## 2019-11-20 ENCOUNTER — Other Ambulatory Visit: Payer: Self-pay

## 2019-11-20 ENCOUNTER — Ambulatory Visit (AMBULATORY_SURGERY_CENTER): Payer: 59 | Admitting: *Deleted

## 2019-11-20 VITALS — Ht 68.0 in | Wt 275.0 lb

## 2019-11-20 DIAGNOSIS — Z86010 Personal history of colonic polyps: Secondary | ICD-10-CM

## 2019-11-20 MED ORDER — SUPREP BOWEL PREP KIT 17.5-3.13-1.6 GM/177ML PO SOLN: 1.0000 | Freq: Once | ORAL | 0 refills | Status: AC

## 2019-11-20 MED FILL — SUPREP BOWEL PREP KIT: 17.5-3.13-1 | 1 days supply | Qty: 354 | Fill #0

## 2019-11-20 NOTE — Progress Notes (Signed)
Pt verified name, DOB, address and insurance during PV today. Pt mailed instruction packet to included paper to complete and mail back to [facility] with addressed and stamped envelope, Emmi video, copy of consent form to read and not return, and instructions. PV completed over the phone. Pt encouraged to call with questions or issues   Instructions sent via My Chart as well   covid vaccines series completed 08-24-2019   No egg or soy allergy known to patient  No issues with past sedation with any surgeries  or procedures, no intubation problems  No diet pills per patient No home 02 use per patient  No blood thinners per patient  Pt denies issues with constipation  No A fib or A flutter  EMMI video sent to pt's e mail   Due to the COVID-19 pandemic we are asking patients to follow these guidelines. Please only bring one care partner. Please be aware that your care partner may wait in the car in the parking lot or if they feel like they will be too hot to wait in the car, they may wait in the lobby on the 4th floor. All care partners are required to wear a mask the entire time (we do not have any that we can provide them), they need to practice social distancing, and we will do a Covid check for all patient's and care partners when you arrive. Also we will check their temperature and your temperature. If the care partner waits in their car they need to stay in the parking lot the entire time and we will call them on their cell phone when the patient is ready for discharge so they can bring the car to the front of the building. Also all patient's will need to wear a mask into building.

## 2019-11-27 MED FILL — VALSARTAN 80 MG TABLET: 80 | 30 days supply | Qty: 30 | Fill #1

## 2019-11-29 ENCOUNTER — Encounter: Payer: Self-pay | Admitting: Internal Medicine

## 2019-12-04 ENCOUNTER — Encounter: Payer: Self-pay | Admitting: Internal Medicine

## 2019-12-04 ENCOUNTER — Other Ambulatory Visit: Payer: Self-pay

## 2019-12-04 ENCOUNTER — Ambulatory Visit (AMBULATORY_SURGERY_CENTER): Payer: 59 | Admitting: Internal Medicine

## 2019-12-04 VITALS — BP 113/71 | HR 57 | Temp 96.8°F | Resp 20 | Ht 69.0 in

## 2019-12-04 DIAGNOSIS — K635 Polyp of colon: Secondary | ICD-10-CM

## 2019-12-04 DIAGNOSIS — Z86010 Personal history of colonic polyps: Secondary | ICD-10-CM | POA: Diagnosis not present

## 2019-12-04 DIAGNOSIS — D123 Benign neoplasm of transverse colon: Secondary | ICD-10-CM

## 2019-12-04 DIAGNOSIS — D124 Benign neoplasm of descending colon: Secondary | ICD-10-CM

## 2019-12-04 DIAGNOSIS — Z1211 Encounter for screening for malignant neoplasm of colon: Secondary | ICD-10-CM | POA: Diagnosis not present

## 2019-12-04 MED ORDER — SODIUM CHLORIDE 0.9 % IV SOLN: 500.0000 mL | INTRAVENOUS | Status: AC

## 2019-12-04 NOTE — Patient Instructions (Signed)
 Handouts on polyps & diverticulosis given to you today  Await pathology results on polyps removed    YOU HAD AN ENDOSCOPIC PROCEDURE TODAY AT THE [facility]:   Refer to the procedure report that was given to you for any specific questions about what was found during the examination.  If the procedure report does not answer your questions, please call your gastroenterologist to clarify.  If you requested that your care partner not be given the details of your procedure findings, then the procedure report has been included in a sealed envelope for you to review at your convenience later.  YOU SHOULD EXPECT: Some feelings of bloating in the abdomen. Passage of more gas than usual.  Walking can help get rid of the air that was put into your GI tract during the procedure and reduce the bloating. If you had a lower endoscopy (such as a colonoscopy or flexible sigmoidoscopy) you may notice spotting of blood in your stool or on the toilet paper. If you underwent a bowel prep for your procedure, you may not have a normal bowel movement for a few days.  Please Note:  You might notice some irritation and congestion in your nose or some drainage.  This is from the oxygen used during your procedure.  There is no need for concern and it should clear up in a day or so.  SYMPTOMS TO REPORT IMMEDIATELY:   Following lower endoscopy (colonoscopy or flexible sigmoidoscopy):  Excessive amounts of blood in the stool  Significant tenderness or worsening of abdominal pains  Swelling of the abdomen that is new, acute  Fever of 100F or higher   For urgent or emergent issues, a gastroenterologist can be reached at any hour by calling [phone]. Do not use MyChart messaging for urgent concerns.    DIET:  We do recommend a small meal at first, but then you may proceed to your regular diet.  Drink plenty of fluids but you should avoid alcoholic beverages for 24 hours.  ACTIVITY:  You should plan  to take it easy for the rest of today and you should NOT DRIVE or use heavy machinery until tomorrow (because of the sedation medicines used during the test).    FOLLOW UP: Our staff will call the number listed on your records 48-72 hours following your procedure to check on you and address any questions or concerns that you may have regarding the information given to you following your procedure. If we do not reach you, we will leave a message.  We will attempt to reach you two times.  During this call, we will ask if you have developed any symptoms of COVID 19. If you develop any symptoms (ie: fever, flu-like symptoms, shortness of breath, cough etc.) before then, please call [phone].  If you test positive for Covid 19 in the 2 weeks post procedure, please call and report this information to us.    If any biopsies were taken you will be contacted by phone or by letter within the next 1-3 weeks.  Please call us at [phone] if you have not heard about the biopsies in 3 weeks.    SIGNATURES/CONFIDENTIALITY: You and/or your care partner have signed paperwork which will be entered into your electronic medical record.  These signatures attest to the fact that that the information above on your After Visit Summary has been reviewed and is understood.  Full responsibility of the confidentiality of this discharge information lies with you and/or   and/or your care-partner. 

## 2019-12-04 NOTE — Progress Notes (Signed)
Called to room to assist during endoscopic procedure.  Patient ID and intended procedure confirmed with present staff. Received instructions for my participation in the procedure from the performing physician.  

## 2019-12-04 NOTE — Op Note (Signed)
[facility] Patient Name: Travis Martinez Procedure Date: 12/04/2019 8:35 AM MRN: [id number] Endoscopist: Jerene Bears , MD Age: 59 Referring MD:  Date of Birth: 04/24/1961 Gender: Male Account #: [id number] Procedure:                Colonoscopy Indications:              High risk colon cancer surveillance: Personal                            history of multiple (9 on last exam) adenomas, Last                            colonoscopy: August 2017 Medicines:                Monitored Anesthesia Care Procedure:                Pre-Anesthesia Assessment:                           - Prior to the procedure, a History and Physical                            was performed, and patient medications and                            allergies were reviewed. The patient's tolerance of                            previous anesthesia was also reviewed. The risks                            and benefits of the procedure and the sedation                            options and risks were discussed with the patient.                            All questions were answered, and informed consent                            was obtained. Prior Anticoagulants: The patient has                            taken no previous anticoagulant or antiplatelet                            agents. ASA Grade Assessment: II - A patient with                            mild systemic disease. After reviewing the risks                            and benefits, the patient was deemed in  satisfactory condition to undergo the procedure.                           After obtaining informed consent, the colonoscope                            was passed under direct vision. Throughout the                            procedure, the patient's blood pressure, pulse, and                            oxygen saturations were monitored continuously. The                            Colonoscope was introduced through the  anus and                            advanced to the cecum, identified by appendiceal                            orifice and ileocecal valve. The colonoscopy was                            performed without difficulty. The patient tolerated                            the procedure well. The quality of the bowel                            preparation was good. The ileocecal valve,                            appendiceal orifice, and rectum were photographed. Scope In: [id number] Scope Out: 9:07:25 AM Scope Withdrawal Time: 0 hours 17 minutes 43 seconds  Total Procedure Duration: 0 hours 20 minutes 44 seconds  Findings:                 The digital rectal exam was normal.                           Two sessile polyps were found in the transverse                            colon. The polyps were 2 to 5 mm in size. These                            polyps were removed with a cold snare. Resection                            and retrieval were complete.                           Two sessile polyps were found in the descending  colon. The polyps were 3 to 4 mm in size. These                            polyps were removed with a cold snare. Resection                            and retrieval were complete.                           Multiple medium-mouthed diverticula were found in                            the sigmoid colon.                           The retroflexed view of the distal rectum and anal                            verge was normal and showed no anal or rectal                            abnormalities. Complications:            No immediate complications. Estimated Blood Loss:     Estimated blood loss was minimal. Impression:               - Two 2 to 5 mm polyps in the transverse colon,                            removed with a cold snare. Resected and retrieved.                           - Two 3 to 4 mm polyps in the descending colon,                             removed with a cold snare. Resected and retrieved.                           - Diverticulosis in the sigmoid colon.                           - The distal rectum and anal verge are normal on                            retroflexion view. Recommendation:           - Patient has a contact number available for                            emergencies. The signs and symptoms of potential                            delayed complications were discussed with the  patient. Return to normal activities tomorrow.                            Written discharge instructions were provided to the                            patient.                           - Resume previous diet.                           - Continue present medications.                           - Await pathology results.                           - Repeat colonoscopy is recommended for                            surveillance. The colonoscopy date will be                            determined after pathology results from today's                            exam become available for review. Jerene Bears, MD 12/04/2019 9:10:43 AM This report has been signed electronically.

## 2019-12-04 NOTE — Progress Notes (Signed)
A/ox3, pleased with MAC, report to RN 

## 2019-12-06 ENCOUNTER — Telehealth: Payer: Self-pay

## 2019-12-06 NOTE — Telephone Encounter (Signed)
  Follow up Call-  Call back number 12/04/2019  Post procedure Call Back phone  # [phone]  Permission to leave phone message Yes  Some recent data might be hidden     Patient questions:  Do you have a fever, pain , or abdominal swelling? No. Pain Score  0 *  Have you tolerated food without any problems? Yes.    Have you been able to return to your normal activities? Yes.    Do you have any questions about your discharge instructions: Diet   No. Medications  No. Follow up visit  No.  Do you have questions or concerns about your Care? No.  Actions: * If pain score is 4 or above: No action needed, pain <4.   1. Have you developed a fever since your procedure? No  2.   Have you had an respiratory symptoms (SOB or cough) since your procedure? No  3.   Have you tested positive for COVID 19 since your procedure No  4.   Have you had any family members/close contacts diagnosed with the COVID 19 since your procedure?  No   If yes to any of these questions please route to Joylene John, RN and Erenest Rasher, RN

## 2019-12-11 ENCOUNTER — Encounter: Payer: Self-pay | Admitting: Internal Medicine

## 2020-01-10 ENCOUNTER — Other Ambulatory Visit: Payer: Self-pay | Admitting: Osteopathic Medicine

## 2020-01-11 ENCOUNTER — Other Ambulatory Visit: Payer: Self-pay

## 2020-01-11 MED ORDER — SIMVASTATIN 40 MG PO TABS: 40.0000 mg | ORAL_TABLET | Freq: Every day | ORAL | 0 refills | Status: DC

## 2020-01-11 MED ORDER — VALSARTAN 80 MG PO TABS: 80.0000 mg | ORAL_TABLET | Freq: Every day | ORAL | 0 refills | Status: DC

## 2020-01-11 MED FILL — VALSARTAN 80 MG TABLET: 80 | 30 days supply | Qty: 30 | Fill #0

## 2020-01-11 MED FILL — SIMVASTATIN 40 MG TABLET: 40 | 30 days supply | Qty: 30 | Fill #0

## 2020-01-30 DIAGNOSIS — G4733 Obstructive sleep apnea (adult) (pediatric): Secondary | ICD-10-CM | POA: Diagnosis not present

## 2020-02-12 ENCOUNTER — Other Ambulatory Visit: Payer: Self-pay | Admitting: Family Medicine

## 2020-02-12 MED FILL — FUROSEMIDE 20 MG TABS: 20 | 30 days supply | Qty: 30 | Fill #0

## 2020-02-13 ENCOUNTER — Other Ambulatory Visit: Payer: Self-pay

## 2020-02-13 MED ORDER — FUROSEMIDE 20 MG PO TABS: 20.0000 mg | ORAL_TABLET | Freq: Every day | ORAL | 0 refills | Status: AC

## 2020-02-13 MED ORDER — SIMVASTATIN 40 MG PO TABS: 40.0000 mg | ORAL_TABLET | Freq: Every day | ORAL | 0 refills | Status: AC

## 2020-02-13 MED ORDER — VALSARTAN 80 MG PO TABS: 80.0000 mg | ORAL_TABLET | Freq: Every day | ORAL | 0 refills | Status: AC

## 2020-02-13 MED FILL — VALSARTAN 80 MG TABLET: 80 | 30 days supply | Qty: 30 | Fill #0

## 2020-02-13 MED FILL — SIMVASTATIN 40 MG TABLET: 40 | 30 days supply | Qty: 30 | Fill #0

## 2020-02-20 ENCOUNTER — Ambulatory Visit: Payer: 59 | Admitting: Family Medicine

## 2020-02-29 ENCOUNTER — Other Ambulatory Visit (HOSPITAL_COMMUNITY): Payer: Self-pay | Admitting: Family Medicine

## 2020-02-29 DIAGNOSIS — R5383 Other fatigue: Secondary | ICD-10-CM | POA: Diagnosis not present

## 2020-02-29 DIAGNOSIS — E785 Hyperlipidemia, unspecified: Secondary | ICD-10-CM | POA: Diagnosis not present

## 2020-02-29 DIAGNOSIS — F5221 Male erectile disorder: Secondary | ICD-10-CM | POA: Diagnosis not present

## 2020-02-29 DIAGNOSIS — R05 Cough: Secondary | ICD-10-CM | POA: Diagnosis not present

## 2020-02-29 DIAGNOSIS — I10 Essential (primary) hypertension: Secondary | ICD-10-CM | POA: Diagnosis not present

## 2020-02-29 DIAGNOSIS — R0981 Nasal congestion: Secondary | ICD-10-CM | POA: Diagnosis not present

## 2020-02-29 MED FILL — AMLODIPINE BESYLATE 5 MG TA: 5 | 30 days supply | Qty: 30 | Fill #0

## 2020-03-07 ENCOUNTER — Other Ambulatory Visit (HOSPITAL_COMMUNITY): Payer: Self-pay | Admitting: Family Medicine

## 2020-03-07 MED FILL — ALLOPURINOL 100 MG TABS: 100 | 90 days supply | Qty: 90 | Fill #0

## 2020-03-08 DIAGNOSIS — G4733 Obstructive sleep apnea (adult) (pediatric): Secondary | ICD-10-CM | POA: Diagnosis not present

## 2020-03-14 MED FILL — FUROSEMIDE 20 MG TABS: 20 | 30 days supply | Qty: 30 | Fill #0

## 2020-03-15 MED FILL — VALSARTAN 80 MG TABLET: 80 | 30 days supply | Qty: 30 | Fill #0

## 2020-03-15 MED FILL — SIMVASTATIN 40 MG TABLET: 40 | 30 days supply | Qty: 30 | Fill #0

## 2020-03-16 DIAGNOSIS — M109 Gout, unspecified: Secondary | ICD-10-CM | POA: Diagnosis not present

## 2020-03-28 DIAGNOSIS — R7989 Other specified abnormal findings of blood chemistry: Secondary | ICD-10-CM | POA: Diagnosis not present

## 2020-04-11 MED FILL — AMLODIPINE BESYLATE 5 MG TA: 5 | 30 days supply | Qty: 30 | Fill #1

## 2020-04-11 MED FILL — FUROSEMIDE 20 MG TABS: 20 | 30 days supply | Qty: 30 | Fill #0

## 2020-04-12 MED FILL — VALSARTAN 80 MG TABLET: 80 | 30 days supply | Qty: 30 | Fill #0

## 2020-04-12 MED FILL — SIMVASTATIN 40 MG TABLET: 40 | 30 days supply | Qty: 30 | Fill #0

## 2020-04-16 DIAGNOSIS — I10 Essential (primary) hypertension: Secondary | ICD-10-CM | POA: Diagnosis not present

## 2020-04-16 DIAGNOSIS — R0789 Other chest pain: Secondary | ICD-10-CM | POA: Diagnosis not present

## 2020-04-16 DIAGNOSIS — R7303 Prediabetes: Secondary | ICD-10-CM | POA: Diagnosis not present

## 2020-04-16 DIAGNOSIS — R7989 Other specified abnormal findings of blood chemistry: Secondary | ICD-10-CM | POA: Diagnosis not present

## 2020-04-16 DIAGNOSIS — Z6841 Body Mass Index (BMI) 40.0 and over, adult: Secondary | ICD-10-CM | POA: Diagnosis not present

## 2020-04-16 DIAGNOSIS — Z1211 Encounter for screening for malignant neoplasm of colon: Secondary | ICD-10-CM | POA: Diagnosis not present

## 2020-04-16 DIAGNOSIS — M109 Gout, unspecified: Secondary | ICD-10-CM | POA: Diagnosis not present

## 2020-04-16 DIAGNOSIS — Z125 Encounter for screening for malignant neoplasm of prostate: Secondary | ICD-10-CM | POA: Diagnosis not present

## 2020-04-16 DIAGNOSIS — Z0000 Encounter for general adult medical examination without abnormal findings: Secondary | ICD-10-CM | POA: Diagnosis not present

## 2020-04-29 DIAGNOSIS — G4733 Obstructive sleep apnea (adult) (pediatric): Secondary | ICD-10-CM | POA: Diagnosis not present

## 2020-04-29 MED FILL — TESTOSTERONE 50 MG/5 GRAM P: 50 MG/5GM | 30 days supply | Qty: 150 | Fill #0

## 2020-05-14 ENCOUNTER — Other Ambulatory Visit (HOSPITAL_COMMUNITY): Payer: Self-pay | Admitting: Family Medicine

## 2020-05-14 DIAGNOSIS — E119 Type 2 diabetes mellitus without complications: Secondary | ICD-10-CM | POA: Diagnosis not present

## 2020-05-14 DIAGNOSIS — H02834 Dermatochalasis of left upper eyelid: Secondary | ICD-10-CM | POA: Diagnosis not present

## 2020-05-14 DIAGNOSIS — H02831 Dermatochalasis of right upper eyelid: Secondary | ICD-10-CM | POA: Diagnosis not present

## 2020-05-14 DIAGNOSIS — H524 Presbyopia: Secondary | ICD-10-CM | POA: Diagnosis not present

## 2020-05-14 DIAGNOSIS — H5319 Other subjective visual disturbances: Secondary | ICD-10-CM | POA: Diagnosis not present

## 2020-05-14 MED FILL — SIMVASTATIN 40 MG TABLET: 40 | 30 days supply | Qty: 30 | Fill #0

## 2020-05-14 MED FILL — FUROSEMIDE 20 MG TABS: 20 | 30 days supply | Qty: 30 | Fill #0

## 2020-05-14 MED FILL — VALSARTAN 80 MG TABLET: 80 | 30 days supply | Qty: 30 | Fill #0

## 2020-05-14 MED FILL — AMLODIPINE BESYLATE 5 MG TA: 5 | 30 days supply | Qty: 30 | Fill #2

## 2020-05-18 MED FILL — ALLOPURINOL 100 MG TABS: 100 | 90 days supply | Qty: 90 | Fill #1

## 2020-06-05 MED FILL — TESTOSTERONE 50 MG/5 GRAM P: 50 MG/5GM | 30 days supply | Qty: 150 | Fill #1

## 2020-06-07 ENCOUNTER — Other Ambulatory Visit (HOSPITAL_COMMUNITY): Payer: Self-pay | Admitting: Family Medicine

## 2020-06-07 MED FILL — FUROSEMIDE 20 MG TABS: 20 | 30 days supply | Qty: 30 | Fill #0

## 2020-06-17 MED FILL — AMLODIPINE BESYLATE 5 MG TA: 5 | 30 days supply | Qty: 30 | Fill #3

## 2020-06-17 MED FILL — SIMVASTATIN 40 MG TABLET: 40 | 30 days supply | Qty: 30 | Fill #1

## 2020-06-17 MED FILL — VALSARTAN 80 MG TABLET: 80 | 30 days supply | Qty: 30 | Fill #1

## 2020-07-02 DIAGNOSIS — M109 Gout, unspecified: Secondary | ICD-10-CM | POA: Diagnosis not present

## 2020-07-02 DIAGNOSIS — R7303 Prediabetes: Secondary | ICD-10-CM | POA: Diagnosis not present

## 2020-07-02 DIAGNOSIS — Z0000 Encounter for general adult medical examination without abnormal findings: Secondary | ICD-10-CM | POA: Diagnosis not present

## 2020-07-02 DIAGNOSIS — Z6841 Body Mass Index (BMI) 40.0 and over, adult: Secondary | ICD-10-CM | POA: Diagnosis not present

## 2020-07-02 DIAGNOSIS — I10 Essential (primary) hypertension: Secondary | ICD-10-CM | POA: Diagnosis not present

## 2020-07-03 DIAGNOSIS — R351 Nocturia: Secondary | ICD-10-CM | POA: Diagnosis not present

## 2020-07-03 DIAGNOSIS — E291 Testicular hypofunction: Secondary | ICD-10-CM | POA: Diagnosis not present

## 2020-07-03 DIAGNOSIS — N401 Enlarged prostate with lower urinary tract symptoms: Secondary | ICD-10-CM | POA: Diagnosis not present

## 2020-07-03 DIAGNOSIS — N529 Male erectile dysfunction, unspecified: Secondary | ICD-10-CM | POA: Diagnosis not present

## 2020-07-03 DIAGNOSIS — R3911 Hesitancy of micturition: Secondary | ICD-10-CM | POA: Diagnosis not present

## 2020-07-10 ENCOUNTER — Other Ambulatory Visit (HOSPITAL_COMMUNITY): Payer: Self-pay | Admitting: Family Medicine

## 2020-07-10 MED FILL — FUROSEMIDE 20 MG TABS: 20 | 30 days supply | Qty: 30 | Fill #1

## 2020-07-11 MED FILL — SIMVASTATIN 40 MG TABLET: 40 | 30 days supply | Qty: 30 | Fill #2

## 2020-07-11 MED FILL — VALSARTAN 80 MG TABLET: 80 | 30 days supply | Qty: 30 | Fill #2

## 2020-07-11 MED FILL — AMLODIPINE BESYLATE 5 MG TA: 5 | 90 days supply | Qty: 90 | Fill #0

## 2020-07-22 DIAGNOSIS — G4733 Obstructive sleep apnea (adult) (pediatric): Secondary | ICD-10-CM | POA: Diagnosis not present

## 2020-07-25 ENCOUNTER — Other Ambulatory Visit (HOSPITAL_COMMUNITY): Payer: Self-pay | Admitting: Family Medicine

## 2020-07-25 DIAGNOSIS — E291 Testicular hypofunction: Secondary | ICD-10-CM | POA: Diagnosis not present

## 2020-07-25 DIAGNOSIS — R3914 Feeling of incomplete bladder emptying: Secondary | ICD-10-CM | POA: Diagnosis not present

## 2020-07-25 DIAGNOSIS — R7303 Prediabetes: Secondary | ICD-10-CM | POA: Diagnosis not present

## 2020-07-25 MED FILL — METFORMIN HCL 500 MG TABS: 500 | 30 days supply | Qty: 60 | Fill #0

## 2020-08-01 MED FILL — TADALAFIL 5 MG TABS: 5 | 30 days supply | Qty: 30 | Fill #0

## 2020-08-05 MED FILL — TESTOSTERONE CYP 200 MG/ML: 200 | 28 days supply | Qty: 2 | Fill #0

## 2020-08-06 MED FILL — FUROSEMIDE 20 MG TABS: 20 | 30 days supply | Qty: 30 | Fill #2

## 2020-08-27 MED FILL — TESTOSTERONE CYP 200 MG/ML: 200 | 28 days supply | Qty: 2 | Fill #1

## 2020-08-28 MED FILL — TADALAFIL 5 MG TABS: 5 | 30 days supply | Qty: 30 | Fill #1

## 2020-08-28 MED FILL — METFORMIN HCL 500 MG TABS: 500 | 30 days supply | Qty: 60 | Fill #1

## 2020-09-02 MED FILL — ALLOPURINOL 100 MG TABS: 100 | 90 days supply | Qty: 90 | Fill #2

## 2020-09-04 ENCOUNTER — Other Ambulatory Visit (HOSPITAL_COMMUNITY): Payer: Self-pay | Admitting: Family Medicine

## 2020-09-04 MED FILL — FUROSEMIDE 20 MG TABS: 20 | 30 days supply | Qty: 30 | Fill #0

## 2020-09-05 DIAGNOSIS — F5221 Male erectile disorder: Secondary | ICD-10-CM | POA: Diagnosis not present

## 2020-09-05 DIAGNOSIS — I10 Essential (primary) hypertension: Secondary | ICD-10-CM | POA: Diagnosis not present

## 2020-09-05 DIAGNOSIS — R7989 Other specified abnormal findings of blood chemistry: Secondary | ICD-10-CM | POA: Diagnosis not present

## 2020-09-05 DIAGNOSIS — E785 Hyperlipidemia, unspecified: Secondary | ICD-10-CM | POA: Diagnosis not present

## 2020-09-08 ENCOUNTER — Other Ambulatory Visit (HOSPITAL_COMMUNITY): Payer: Self-pay | Admitting: Family Medicine

## 2020-09-09 MED FILL — TADALAFIL 5 MG TABS: 5 | 90 days supply | Qty: 90 | Fill #0

## 2020-09-11 MED FILL — SIMVASTATIN 40 MG TABLET: 40 | 90 days supply | Qty: 90 | Fill #0

## 2020-09-11 MED FILL — VALSARTAN 80 MG TABLET: 80 | 90 days supply | Qty: 90 | Fill #0

## 2020-09-30 MED FILL — FUROSEMIDE 20 MG TABS: 20 | 30 days supply | Qty: 30 | Fill #1

## 2020-09-30 MED FILL — TESTOSTERONE CYP 200 MG/ML: 200 | 28 days supply | Qty: 2 | Fill #2

## 2020-09-30 MED FILL — METFORMIN HCL 500 MG TABS: 500 | 30 days supply | Qty: 60 | Fill #2

## 2020-10-14 ENCOUNTER — Other Ambulatory Visit (HOSPITAL_COMMUNITY): Payer: Self-pay | Admitting: Family Medicine

## 2020-10-14 ENCOUNTER — Other Ambulatory Visit (HOSPITAL_BASED_OUTPATIENT_CLINIC_OR_DEPARTMENT_OTHER): Payer: Self-pay

## 2020-10-14 DIAGNOSIS — G4733 Obstructive sleep apnea (adult) (pediatric): Secondary | ICD-10-CM | POA: Diagnosis not present

## 2020-10-14 MED FILL — AMLODIPINE BESYLATE 5 MG TA: 5 | 90 days supply | Qty: 90 | Fill #0

## 2020-10-18 ENCOUNTER — Other Ambulatory Visit (HOSPITAL_BASED_OUTPATIENT_CLINIC_OR_DEPARTMENT_OTHER): Payer: Self-pay

## 2020-10-28 ENCOUNTER — Other Ambulatory Visit (HOSPITAL_COMMUNITY): Payer: Self-pay

## 2020-10-28 MED FILL — Testosterone Cypionate IM Inj in Oil 200 MG/ML: INTRAMUSCULAR | 28 days supply | Qty: 2 | Fill #0 | Status: AC

## 2020-10-28 MED FILL — Furosemide Tab 20 MG: ORAL | 30 days supply | Qty: 30 | Fill #0 | Status: AC

## 2020-11-24 ENCOUNTER — Other Ambulatory Visit: Payer: Self-pay

## 2020-11-24 MED FILL — Testosterone Cypionate IM Inj in Oil 200 MG/ML: INTRAMUSCULAR | 28 days supply | Qty: 2 | Fill #1 | Status: AC

## 2020-11-25 ENCOUNTER — Other Ambulatory Visit: Payer: Self-pay

## 2020-11-25 ENCOUNTER — Other Ambulatory Visit (HOSPITAL_COMMUNITY): Payer: Self-pay

## 2020-11-26 ENCOUNTER — Other Ambulatory Visit (HOSPITAL_COMMUNITY): Payer: Self-pay

## 2020-11-28 ENCOUNTER — Other Ambulatory Visit (HOSPITAL_COMMUNITY): Payer: Self-pay

## 2020-11-29 ENCOUNTER — Other Ambulatory Visit (HOSPITAL_COMMUNITY): Payer: Self-pay

## 2020-12-02 ENCOUNTER — Other Ambulatory Visit (HOSPITAL_COMMUNITY): Payer: Self-pay

## 2020-12-02 ENCOUNTER — Other Ambulatory Visit: Payer: Self-pay

## 2020-12-02 DIAGNOSIS — E785 Hyperlipidemia, unspecified: Secondary | ICD-10-CM | POA: Diagnosis not present

## 2020-12-02 DIAGNOSIS — R7989 Other specified abnormal findings of blood chemistry: Secondary | ICD-10-CM | POA: Diagnosis not present

## 2020-12-02 MED ORDER — FUROSEMIDE 20 MG PO TABS
ORAL_TABLET | ORAL | 1 refills | Status: DC
  Filled 2020-12-02: qty 90, 90d supply, fill #0
  Filled 2020-12-27 – 2021-04-07 (×2): qty 90, 90d supply, fill #1

## 2020-12-03 ENCOUNTER — Other Ambulatory Visit (HOSPITAL_COMMUNITY): Payer: Self-pay

## 2020-12-10 ENCOUNTER — Other Ambulatory Visit (HOSPITAL_COMMUNITY): Payer: Self-pay

## 2020-12-10 DIAGNOSIS — R339 Retention of urine, unspecified: Secondary | ICD-10-CM | POA: Diagnosis not present

## 2020-12-10 DIAGNOSIS — R140 Abdominal distension (gaseous): Secondary | ICD-10-CM | POA: Diagnosis not present

## 2020-12-10 DIAGNOSIS — M109 Gout, unspecified: Secondary | ICD-10-CM | POA: Diagnosis not present

## 2020-12-10 DIAGNOSIS — R0600 Dyspnea, unspecified: Secondary | ICD-10-CM | POA: Diagnosis not present

## 2020-12-10 DIAGNOSIS — R1011 Right upper quadrant pain: Secondary | ICD-10-CM | POA: Diagnosis not present

## 2020-12-10 DIAGNOSIS — K828 Other specified diseases of gallbladder: Secondary | ICD-10-CM | POA: Diagnosis not present

## 2020-12-10 DIAGNOSIS — R160 Hepatomegaly, not elsewhere classified: Secondary | ICD-10-CM | POA: Diagnosis not present

## 2020-12-10 DIAGNOSIS — R008 Other abnormalities of heart beat: Secondary | ICD-10-CM | POA: Diagnosis not present

## 2020-12-10 DIAGNOSIS — K760 Fatty (change of) liver, not elsewhere classified: Secondary | ICD-10-CM | POA: Diagnosis not present

## 2020-12-10 DIAGNOSIS — R0602 Shortness of breath: Secondary | ICD-10-CM | POA: Diagnosis not present

## 2020-12-10 MED ORDER — INDOMETHACIN 50 MG PO CAPS
50.0000 mg | ORAL_CAPSULE | Freq: Three times a day (TID) | ORAL | 0 refills | Status: DC | PRN
  Filled 2020-12-10: qty 21, 7d supply, fill #0

## 2020-12-10 MED ORDER — TAMSULOSIN HCL 0.4 MG PO CAPS
0.4000 mg | ORAL_CAPSULE | Freq: Every day | ORAL | 3 refills | Status: DC
  Filled 2020-12-10: qty 30, 30d supply, fill #0
  Filled 2020-12-27: qty 30, 30d supply, fill #1
  Filled 2021-01-03: qty 90, 90d supply, fill #1

## 2020-12-10 MED FILL — Allopurinol Tab 100 MG: ORAL | 90 days supply | Qty: 90 | Fill #0 | Status: AC

## 2020-12-10 MED FILL — Metformin HCl Tab 500 MG: ORAL | 90 days supply | Qty: 180 | Fill #0 | Status: AC

## 2020-12-12 ENCOUNTER — Other Ambulatory Visit: Payer: Self-pay

## 2020-12-12 ENCOUNTER — Other Ambulatory Visit (HOSPITAL_COMMUNITY): Payer: Self-pay

## 2020-12-16 ENCOUNTER — Other Ambulatory Visit (HOSPITAL_COMMUNITY): Payer: Self-pay

## 2020-12-16 ENCOUNTER — Other Ambulatory Visit: Payer: Self-pay | Admitting: Family Medicine

## 2020-12-16 MED ORDER — ALLOPURINOL 100 MG PO TABS
ORAL_TABLET | Freq: Every day | ORAL | 3 refills | Status: AC
  Filled 2020-12-16 – 2021-07-14 (×3): qty 90, 90d supply, fill #0
  Filled 2021-11-06: qty 90, 90d supply, fill #1

## 2020-12-17 ENCOUNTER — Other Ambulatory Visit (HOSPITAL_COMMUNITY): Payer: Self-pay

## 2020-12-18 ENCOUNTER — Other Ambulatory Visit (HOSPITAL_COMMUNITY): Payer: Self-pay

## 2020-12-18 MED FILL — Valsartan Tab 80 MG: ORAL | 90 days supply | Qty: 90 | Fill #0 | Status: AC

## 2020-12-18 MED FILL — Simvastatin Tab 40 MG: ORAL | 90 days supply | Qty: 90 | Fill #0 | Status: AC

## 2020-12-19 ENCOUNTER — Other Ambulatory Visit (HOSPITAL_COMMUNITY): Payer: Self-pay

## 2020-12-24 ENCOUNTER — Other Ambulatory Visit (HOSPITAL_COMMUNITY): Payer: Self-pay

## 2020-12-25 ENCOUNTER — Other Ambulatory Visit (HOSPITAL_COMMUNITY): Payer: Self-pay

## 2020-12-27 ENCOUNTER — Other Ambulatory Visit: Payer: Self-pay

## 2020-12-27 ENCOUNTER — Other Ambulatory Visit (HOSPITAL_COMMUNITY): Payer: Self-pay

## 2020-12-27 DIAGNOSIS — I519 Heart disease, unspecified: Secondary | ICD-10-CM | POA: Diagnosis not present

## 2020-12-27 DIAGNOSIS — I371 Nonrheumatic pulmonary valve insufficiency: Secondary | ICD-10-CM | POA: Diagnosis not present

## 2020-12-27 DIAGNOSIS — I77810 Thoracic aortic ectasia: Secondary | ICD-10-CM | POA: Diagnosis not present

## 2020-12-27 MED ORDER — TESTOSTERONE CYPIONATE 200 MG/ML IM SOLN
INTRAMUSCULAR | 0 refills | Status: DC
  Filled 2020-12-27: qty 2, 28d supply, fill #0

## 2020-12-27 MED ORDER — INDOMETHACIN 50 MG PO CAPS
ORAL_CAPSULE | ORAL | 0 refills | Status: AC
  Filled 2020-12-27: qty 21, 7d supply, fill #0

## 2020-12-27 MED FILL — Tadalafil Tab 5 MG: ORAL | 90 days supply | Qty: 90 | Fill #0 | Status: CN

## 2020-12-27 MED FILL — Valsartan Tab 80 MG: ORAL | 90 days supply | Qty: 90 | Fill #1 | Status: CN

## 2020-12-27 MED FILL — Amlodipine Besylate Tab 5 MG (Base Equivalent): ORAL | 90 days supply | Qty: 90 | Fill #0 | Status: AC

## 2020-12-27 MED FILL — Simvastatin Tab 40 MG: ORAL | 90 days supply | Qty: 90 | Fill #1 | Status: CN

## 2020-12-28 ENCOUNTER — Other Ambulatory Visit (HOSPITAL_COMMUNITY): Payer: Self-pay

## 2020-12-28 MED ORDER — TESTOSTERONE CYPIONATE 200 MG/ML IM SOLN
INTRAMUSCULAR | 0 refills | Status: DC
  Filled 2020-12-28 – 2021-01-22 (×2): qty 2, 28d supply, fill #0

## 2021-01-01 ENCOUNTER — Other Ambulatory Visit (HOSPITAL_COMMUNITY): Payer: Self-pay

## 2021-01-02 ENCOUNTER — Other Ambulatory Visit (HOSPITAL_COMMUNITY): Payer: Self-pay

## 2021-01-03 ENCOUNTER — Other Ambulatory Visit (HOSPITAL_COMMUNITY): Payer: Self-pay

## 2021-01-03 MED FILL — Tadalafil Tab 5 MG: ORAL | 90 days supply | Qty: 90 | Fill #0 | Status: AC

## 2021-01-06 ENCOUNTER — Other Ambulatory Visit (HOSPITAL_COMMUNITY): Payer: Self-pay

## 2021-01-06 MED ORDER — METFORMIN HCL 500 MG PO TABS
1.0000 | ORAL_TABLET | Freq: Two times a day (BID) | ORAL | 5 refills | Status: DC
  Filled 2021-01-06 – 2021-05-19 (×2): qty 60, 30d supply, fill #0

## 2021-01-07 DIAGNOSIS — G4733 Obstructive sleep apnea (adult) (pediatric): Secondary | ICD-10-CM | POA: Diagnosis not present

## 2021-01-08 DIAGNOSIS — R008 Other abnormalities of heart beat: Secondary | ICD-10-CM | POA: Diagnosis not present

## 2021-01-17 ENCOUNTER — Other Ambulatory Visit (HOSPITAL_COMMUNITY): Payer: Self-pay

## 2021-01-21 DIAGNOSIS — R7303 Prediabetes: Secondary | ICD-10-CM | POA: Diagnosis not present

## 2021-01-21 DIAGNOSIS — E559 Vitamin D deficiency, unspecified: Secondary | ICD-10-CM | POA: Diagnosis not present

## 2021-01-21 DIAGNOSIS — I10 Essential (primary) hypertension: Secondary | ICD-10-CM | POA: Diagnosis not present

## 2021-01-21 DIAGNOSIS — R600 Localized edema: Secondary | ICD-10-CM | POA: Diagnosis not present

## 2021-01-21 DIAGNOSIS — K760 Fatty (change of) liver, not elsewhere classified: Secondary | ICD-10-CM | POA: Diagnosis not present

## 2021-01-22 ENCOUNTER — Other Ambulatory Visit (HOSPITAL_COMMUNITY): Payer: Self-pay

## 2021-01-23 ENCOUNTER — Other Ambulatory Visit (HOSPITAL_COMMUNITY): Payer: Self-pay

## 2021-01-23 MED ORDER — OZEMPIC (0.25 OR 0.5 MG/DOSE) 2 MG/1.5ML ~~LOC~~ SOPN: PEN_INJECTOR | SUBCUTANEOUS | 0 refills | Status: AC

## 2021-01-23 MED ORDER — OZEMPIC (0.25 OR 0.5 MG/DOSE) 2 MG/1.5ML ~~LOC~~ SOPN
PEN_INJECTOR | SUBCUTANEOUS | 1 refills | Status: DC
  Filled 2021-01-23: qty 1.5, 56d supply, fill #0

## 2021-01-23 MED ORDER — OZEMPIC (1 MG/DOSE) 4 MG/3ML ~~LOC~~ SOPN: PEN_INJECTOR | SUBCUTANEOUS | 5 refills | Status: AC

## 2021-02-06 ENCOUNTER — Other Ambulatory Visit (HOSPITAL_COMMUNITY): Payer: Self-pay

## 2021-02-07 ENCOUNTER — Other Ambulatory Visit (HOSPITAL_COMMUNITY): Payer: Self-pay

## 2021-02-07 MED ORDER — TESTOSTERONE CYPIONATE 200 MG/ML IM SOLN
INTRAMUSCULAR | 0 refills | Status: DC
  Filled 2021-02-07: qty 2, 28d supply, fill #0

## 2021-02-11 DIAGNOSIS — R110 Nausea: Secondary | ICD-10-CM | POA: Diagnosis not present

## 2021-02-11 DIAGNOSIS — T50995A Adverse effect of other drugs, medicaments and biological substances, initial encounter: Secondary | ICD-10-CM | POA: Diagnosis not present

## 2021-02-11 DIAGNOSIS — T383X5A Adverse effect of insulin and oral hypoglycemic [antidiabetic] drugs, initial encounter: Secondary | ICD-10-CM | POA: Diagnosis not present

## 2021-02-11 DIAGNOSIS — E860 Dehydration: Secondary | ICD-10-CM | POA: Diagnosis not present

## 2021-02-11 DIAGNOSIS — R197 Diarrhea, unspecified: Secondary | ICD-10-CM | POA: Diagnosis not present

## 2021-02-11 DIAGNOSIS — R000 Tachycardia, unspecified: Secondary | ICD-10-CM | POA: Diagnosis not present

## 2021-02-11 DIAGNOSIS — R112 Nausea with vomiting, unspecified: Secondary | ICD-10-CM | POA: Diagnosis not present

## 2021-02-18 DIAGNOSIS — H905 Unspecified sensorineural hearing loss: Secondary | ICD-10-CM | POA: Diagnosis not present

## 2021-02-19 ENCOUNTER — Other Ambulatory Visit (HOSPITAL_COMMUNITY): Payer: Self-pay

## 2021-02-21 DIAGNOSIS — Z713 Dietary counseling and surveillance: Secondary | ICD-10-CM | POA: Diagnosis not present

## 2021-02-21 DIAGNOSIS — R634 Abnormal weight loss: Secondary | ICD-10-CM | POA: Diagnosis not present

## 2021-03-11 DIAGNOSIS — E785 Hyperlipidemia, unspecified: Secondary | ICD-10-CM | POA: Diagnosis not present

## 2021-03-11 DIAGNOSIS — R609 Edema, unspecified: Secondary | ICD-10-CM | POA: Diagnosis not present

## 2021-03-11 DIAGNOSIS — Z79899 Other long term (current) drug therapy: Secondary | ICD-10-CM | POA: Diagnosis not present

## 2021-03-11 DIAGNOSIS — I498 Other specified cardiac arrhythmias: Secondary | ICD-10-CM | POA: Diagnosis not present

## 2021-03-11 DIAGNOSIS — I493 Ventricular premature depolarization: Secondary | ICD-10-CM | POA: Diagnosis not present

## 2021-03-11 DIAGNOSIS — I10 Essential (primary) hypertension: Secondary | ICD-10-CM | POA: Diagnosis not present

## 2021-03-18 DIAGNOSIS — H903 Sensorineural hearing loss, bilateral: Secondary | ICD-10-CM | POA: Diagnosis not present

## 2021-03-20 ENCOUNTER — Other Ambulatory Visit (HOSPITAL_COMMUNITY): Payer: Self-pay

## 2021-03-21 ENCOUNTER — Other Ambulatory Visit (HOSPITAL_COMMUNITY): Payer: Self-pay

## 2021-03-24 ENCOUNTER — Other Ambulatory Visit (HOSPITAL_COMMUNITY): Payer: Self-pay

## 2021-03-24 MED ORDER — TESTOSTERONE CYPIONATE 200 MG/ML IM SOLN
INTRAMUSCULAR | 0 refills | Status: DC
  Filled 2021-03-24: qty 2, 28d supply, fill #0

## 2021-03-25 ENCOUNTER — Other Ambulatory Visit (HOSPITAL_COMMUNITY): Payer: Self-pay

## 2021-03-25 DIAGNOSIS — R634 Abnormal weight loss: Secondary | ICD-10-CM | POA: Diagnosis not present

## 2021-03-25 DIAGNOSIS — R7303 Prediabetes: Secondary | ICD-10-CM | POA: Diagnosis not present

## 2021-03-25 MED ORDER — OZEMPIC (1 MG/DOSE) 4 MG/3ML ~~LOC~~ SOPN
PEN_INJECTOR | SUBCUTANEOUS | 5 refills | Status: AC
  Filled 2021-03-25: qty 3, 28d supply, fill #0
  Filled 2021-10-31: qty 3, 28d supply, fill #1

## 2021-04-07 ENCOUNTER — Other Ambulatory Visit (HOSPITAL_COMMUNITY): Payer: Self-pay

## 2021-04-07 MED FILL — Valsartan Tab 80 MG: ORAL | 90 days supply | Qty: 90 | Fill #1 | Status: AC

## 2021-04-07 MED FILL — Simvastatin Tab 40 MG: ORAL | 90 days supply | Qty: 90 | Fill #1 | Status: AC

## 2021-04-15 ENCOUNTER — Other Ambulatory Visit (HOSPITAL_COMMUNITY): Payer: Self-pay

## 2021-04-15 MED ORDER — TAMSULOSIN HCL 0.4 MG PO CAPS
0.4000 mg | ORAL_CAPSULE | Freq: Every day | ORAL | 3 refills | Status: DC
  Filled 2021-04-15: qty 30, 30d supply, fill #0
  Filled 2021-05-19: qty 30, 30d supply, fill #1

## 2021-04-15 MED FILL — Tadalafil Tab 5 MG: ORAL | 90 days supply | Qty: 90 | Fill #1 | Status: AC

## 2021-04-15 MED FILL — Valsartan Tab 80 MG: ORAL | 90 days supply | Qty: 90 | Fill #2 | Status: CN

## 2021-04-25 ENCOUNTER — Other Ambulatory Visit (HOSPITAL_COMMUNITY): Payer: Self-pay

## 2021-04-25 DIAGNOSIS — E559 Vitamin D deficiency, unspecified: Secondary | ICD-10-CM | POA: Diagnosis not present

## 2021-04-25 DIAGNOSIS — E785 Hyperlipidemia, unspecified: Secondary | ICD-10-CM | POA: Diagnosis not present

## 2021-04-25 DIAGNOSIS — I10 Essential (primary) hypertension: Secondary | ICD-10-CM | POA: Diagnosis not present

## 2021-04-25 DIAGNOSIS — R7303 Prediabetes: Secondary | ICD-10-CM | POA: Diagnosis not present

## 2021-04-25 DIAGNOSIS — F5221 Male erectile disorder: Secondary | ICD-10-CM | POA: Diagnosis not present

## 2021-04-25 DIAGNOSIS — R7989 Other specified abnormal findings of blood chemistry: Secondary | ICD-10-CM | POA: Diagnosis not present

## 2021-04-25 MED ORDER — OZEMPIC (1 MG/DOSE) 4 MG/3ML ~~LOC~~ SOPN
PEN_INJECTOR | SUBCUTANEOUS | 5 refills | Status: AC
  Filled 2021-04-25: qty 3, 28d supply, fill #0
  Filled 2021-05-23: qty 3, 28d supply, fill #1
  Filled 2021-06-18: qty 3, 28d supply, fill #2
  Filled 2021-07-16: qty 3, 28d supply, fill #3
  Filled 2021-08-13: qty 3, 28d supply, fill #4
  Filled 2021-09-13: qty 3, 28d supply, fill #5

## 2021-04-29 ENCOUNTER — Other Ambulatory Visit (HOSPITAL_COMMUNITY): Payer: Self-pay

## 2021-05-01 ENCOUNTER — Other Ambulatory Visit (HOSPITAL_COMMUNITY): Payer: Self-pay

## 2021-05-01 MED ORDER — TESTOSTERONE CYPIONATE 200 MG/ML IM SOLN
INTRAMUSCULAR | 0 refills | Status: DC
  Filled 2021-05-01: qty 2, 28d supply, fill #0

## 2021-05-07 ENCOUNTER — Ambulatory Visit: Payer: 59 | Admitting: Physician Assistant

## 2021-05-19 ENCOUNTER — Other Ambulatory Visit (HOSPITAL_COMMUNITY): Payer: Self-pay

## 2021-05-19 MED FILL — Amlodipine Besylate Tab 5 MG (Base Equivalent): ORAL | 90 days supply | Qty: 90 | Fill #1 | Status: AC

## 2021-05-20 ENCOUNTER — Other Ambulatory Visit (HOSPITAL_COMMUNITY): Payer: Self-pay

## 2021-05-20 MED ORDER — TAMSULOSIN HCL 0.4 MG PO CAPS
0.4000 mg | ORAL_CAPSULE | Freq: Every day | ORAL | 3 refills | Status: DC
  Filled 2021-05-20: qty 90, 90d supply, fill #0
  Filled 2021-09-18: qty 30, 30d supply, fill #1

## 2021-05-20 MED ORDER — METFORMIN HCL 500 MG PO TABS
500.0000 mg | ORAL_TABLET | Freq: Two times a day (BID) | ORAL | 5 refills | Status: AC
  Filled 2021-05-20: qty 180, 90d supply, fill #0
  Filled 2021-12-26: qty 180, 90d supply, fill #1

## 2021-05-21 DIAGNOSIS — H02831 Dermatochalasis of right upper eyelid: Secondary | ICD-10-CM | POA: Diagnosis not present

## 2021-05-21 DIAGNOSIS — E119 Type 2 diabetes mellitus without complications: Secondary | ICD-10-CM | POA: Diagnosis not present

## 2021-05-21 DIAGNOSIS — H02834 Dermatochalasis of left upper eyelid: Secondary | ICD-10-CM | POA: Diagnosis not present

## 2021-05-21 DIAGNOSIS — H2512 Age-related nuclear cataract, left eye: Secondary | ICD-10-CM | POA: Diagnosis not present

## 2021-05-23 ENCOUNTER — Other Ambulatory Visit (HOSPITAL_COMMUNITY): Payer: Self-pay

## 2021-05-28 DIAGNOSIS — R7989 Other specified abnormal findings of blood chemistry: Secondary | ICD-10-CM | POA: Diagnosis not present

## 2021-05-28 DIAGNOSIS — E559 Vitamin D deficiency, unspecified: Secondary | ICD-10-CM | POA: Diagnosis not present

## 2021-05-28 DIAGNOSIS — R7303 Prediabetes: Secondary | ICD-10-CM | POA: Diagnosis not present

## 2021-05-28 DIAGNOSIS — F5221 Male erectile disorder: Secondary | ICD-10-CM | POA: Diagnosis not present

## 2021-05-28 DIAGNOSIS — E785 Hyperlipidemia, unspecified: Secondary | ICD-10-CM | POA: Diagnosis not present

## 2021-05-28 DIAGNOSIS — I10 Essential (primary) hypertension: Secondary | ICD-10-CM | POA: Diagnosis not present

## 2021-06-05 ENCOUNTER — Other Ambulatory Visit (HOSPITAL_COMMUNITY): Payer: Self-pay

## 2021-06-05 DIAGNOSIS — Z1211 Encounter for screening for malignant neoplasm of colon: Secondary | ICD-10-CM | POA: Diagnosis not present

## 2021-06-05 DIAGNOSIS — Z23 Encounter for immunization: Secondary | ICD-10-CM | POA: Diagnosis not present

## 2021-06-05 DIAGNOSIS — R7303 Prediabetes: Secondary | ICD-10-CM | POA: Diagnosis not present

## 2021-06-05 DIAGNOSIS — Z125 Encounter for screening for malignant neoplasm of prostate: Secondary | ICD-10-CM | POA: Diagnosis not present

## 2021-06-05 DIAGNOSIS — R7989 Other specified abnormal findings of blood chemistry: Secondary | ICD-10-CM | POA: Diagnosis not present

## 2021-06-05 DIAGNOSIS — Z135 Encounter for screening for eye and ear disorders: Secondary | ICD-10-CM | POA: Diagnosis not present

## 2021-06-05 DIAGNOSIS — Z0000 Encounter for general adult medical examination without abnormal findings: Secondary | ICD-10-CM | POA: Diagnosis not present

## 2021-06-05 MED ORDER — METFORMIN HCL ER 500 MG PO TB24
ORAL_TABLET | ORAL | 3 refills | Status: AC
  Filled 2021-06-05: qty 30, 30d supply, fill #0

## 2021-06-06 ENCOUNTER — Other Ambulatory Visit (HOSPITAL_COMMUNITY): Payer: Self-pay

## 2021-06-06 MED ORDER — TESTOSTERONE CYPIONATE 200 MG/ML IM SOLN
INTRAMUSCULAR | 0 refills | Status: DC
  Filled 2021-06-06: qty 2, 28d supply, fill #0
  Filled 2021-07-14: qty 2, 28d supply, fill #1

## 2021-06-18 ENCOUNTER — Other Ambulatory Visit (HOSPITAL_COMMUNITY): Payer: Self-pay

## 2021-06-25 ENCOUNTER — Other Ambulatory Visit (HOSPITAL_COMMUNITY): Payer: Self-pay

## 2021-07-14 ENCOUNTER — Other Ambulatory Visit (HOSPITAL_COMMUNITY): Payer: Self-pay

## 2021-07-14 MED FILL — Simvastatin Tab 40 MG: ORAL | 90 days supply | Qty: 90 | Fill #2 | Status: AC

## 2021-07-14 MED FILL — Valsartan Tab 80 MG: ORAL | 90 days supply | Qty: 90 | Fill #2 | Status: AC

## 2021-07-15 ENCOUNTER — Other Ambulatory Visit (HOSPITAL_COMMUNITY): Payer: Self-pay

## 2021-07-15 MED ORDER — FUROSEMIDE 20 MG PO TABS
20.0000 mg | ORAL_TABLET | Freq: Every day | ORAL | 3 refills | Status: AC
  Filled 2021-07-15: qty 90, 90d supply, fill #0
  Filled 2022-03-12: qty 90, 90d supply, fill #1

## 2021-07-17 ENCOUNTER — Other Ambulatory Visit (HOSPITAL_COMMUNITY): Payer: Self-pay

## 2021-08-05 IMAGING — CT CT ANGIO CHEST
2 of 7 series · 18 of 46 positions shown · IV contrast (OMNIPAQUE 350)
Comparison: April 13, 2017

CLINICAL DATA: 58-year-old male with a history of thoracic aortic
aneurysm

EXAM:
CT ANGIOGRAPHY CHEST WITH CONTRAST
TECHNIQUE: Multidetector CT imaging of the chest was performed using the
standard protocol during bolus administration of intravenous
contrast. Multiplanar CT image reconstructions and MIPs were
obtained to evaluate the vascular anatomy.
CONTRAST:  100mL OMNIPAQUE IOHEXOL 300 MG/ML  SOLN

[Series 4: aorta 3.0 i31f 2 · axial · 0.83mm/px · z∈[-368,-80]mm · 15 of 104 slices shown]
[im 4/104  lung]
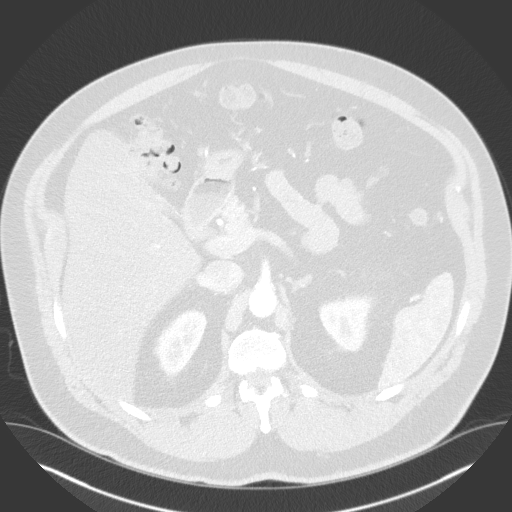
[im 12/104  soft-tissue]
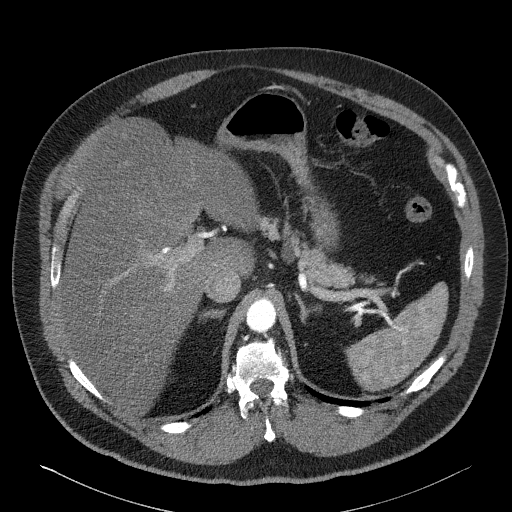
[im 20/104  lung]
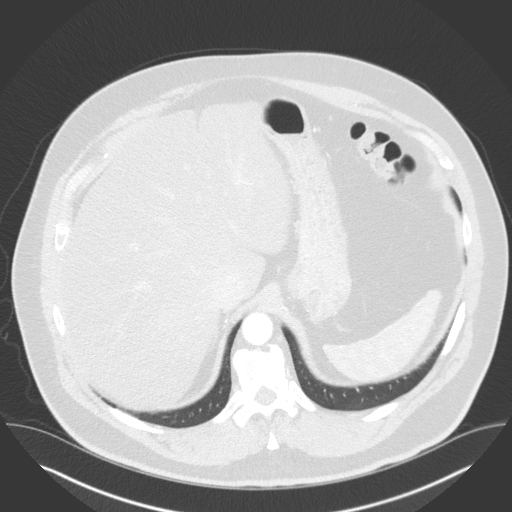
[im 27/104  soft-tissue]
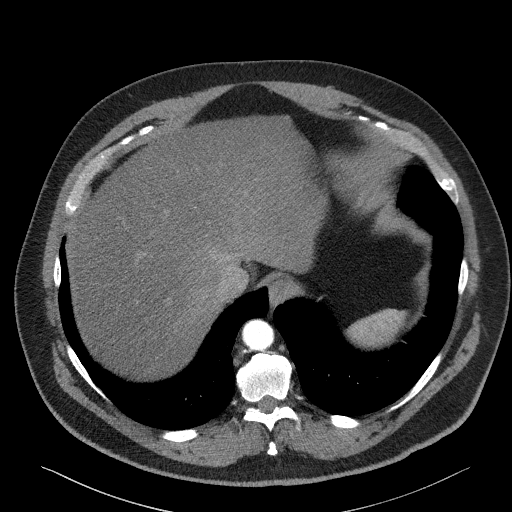
[im 31/104  lung]
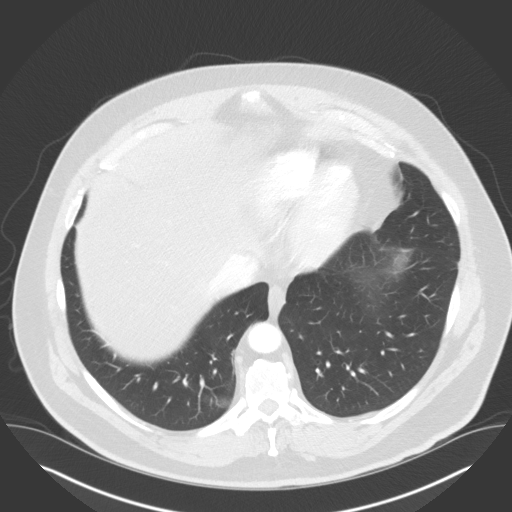
[im 39/104  soft-tissue]
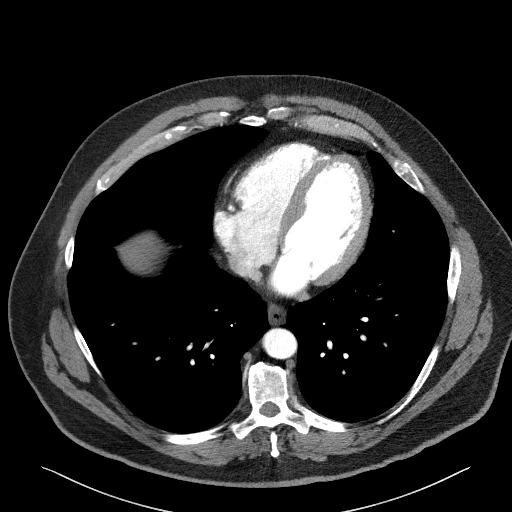
[im 46/104  lung]
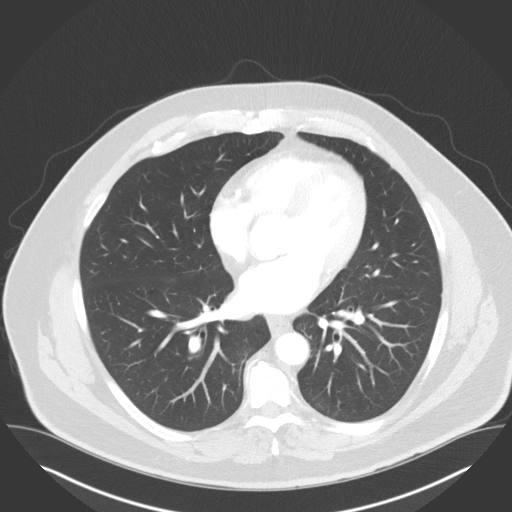
[im 54/104  soft-tissue]
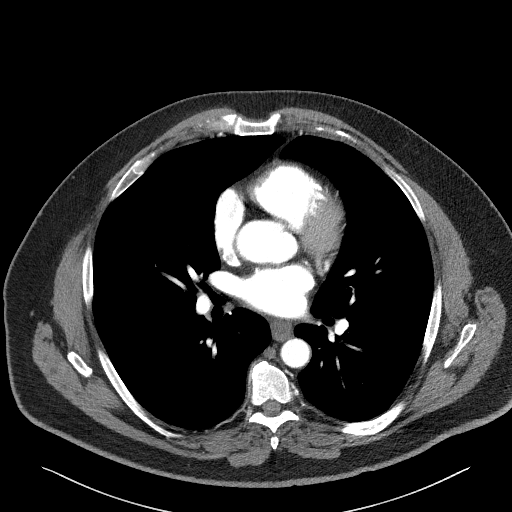
[im 58/104  lung]
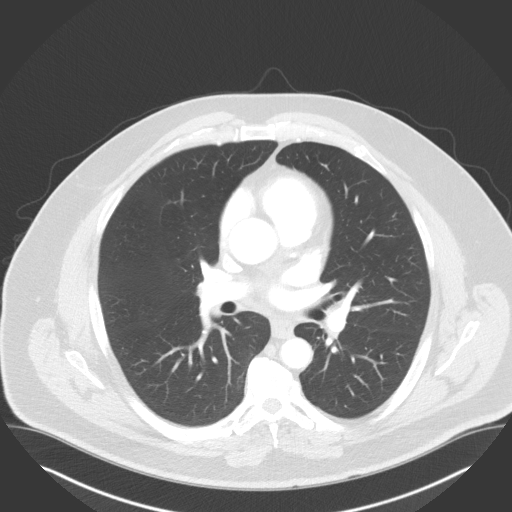
[im 65/104  soft-tissue]
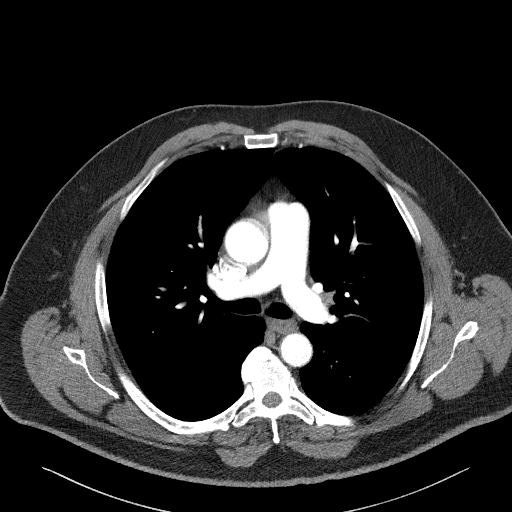
[im 73/104  lung]
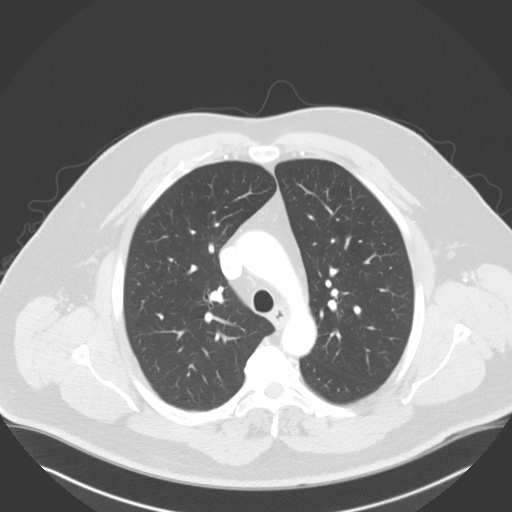
[im 77/104  soft-tissue]
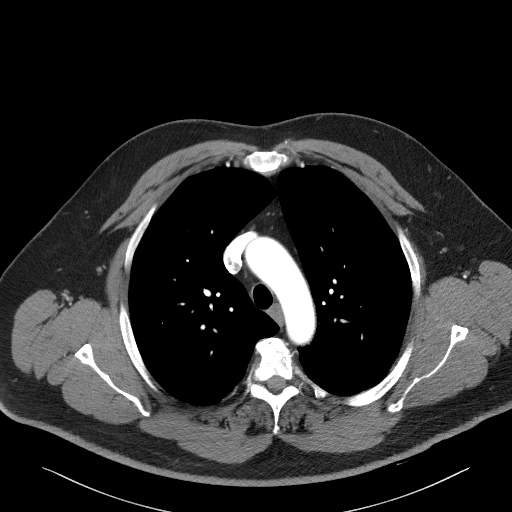
[im 84/104  lung]
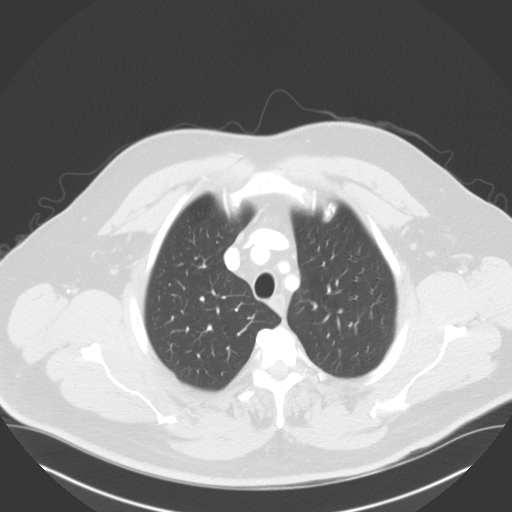
[im 92/104  soft-tissue]
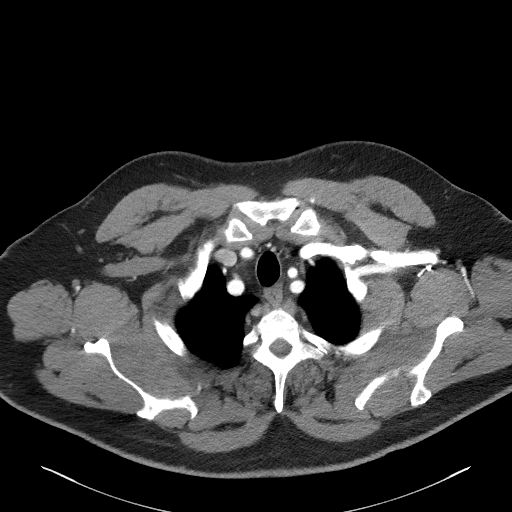
[im 100/104  lung]
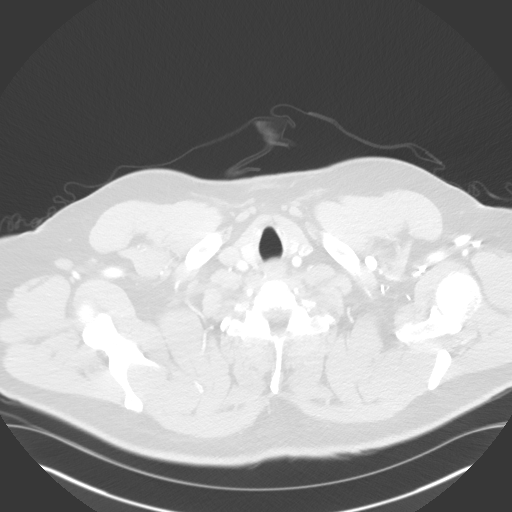

[Series 7: coronals · coronal · 0.63mm/px · 3 of 163 slices shown]
[im 41/163  soft-tissue]
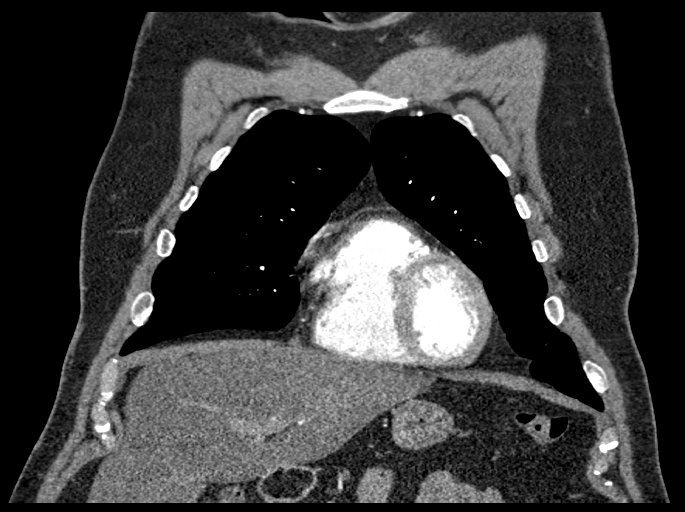
[im 82/163  soft-tissue]
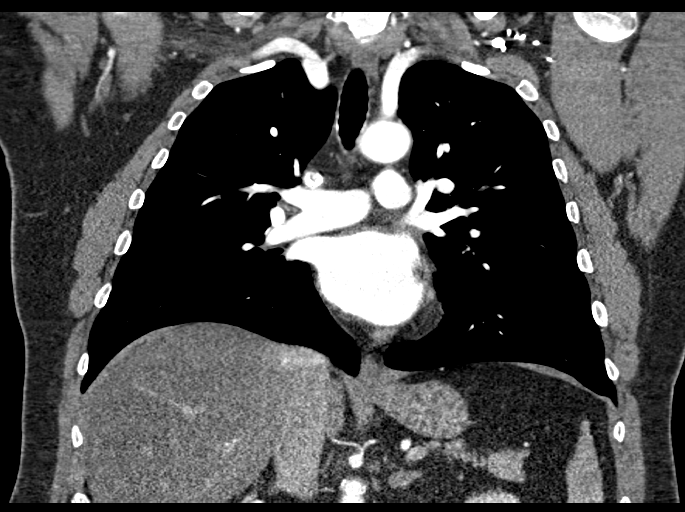
[im 122/163  soft-tissue]
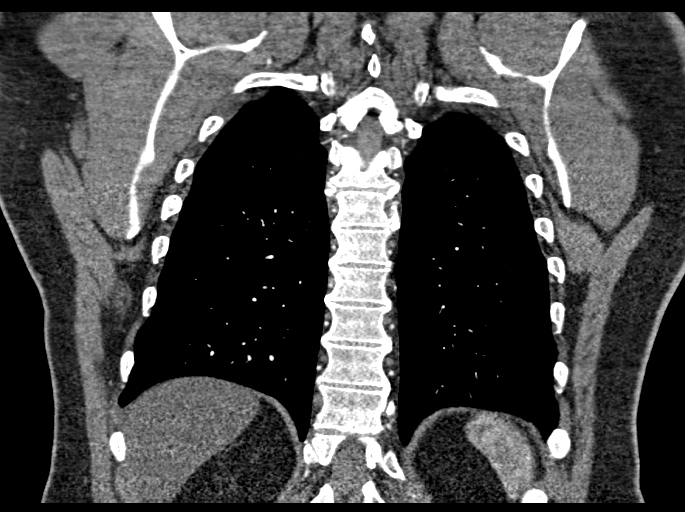

[18 of 46 positions shown; findings below may reference images not displayed]

FINDINGS: Cardiovascular:

Heart:

No cardiomegaly. No pericardial fluid/thickening. Calcifications of
the left main and right coronary arteries.

Aorta:

No periaortic fluid or inflammatory changes.

No dissection flap.

Estimated diameter of the annulus 30 mm. Diameter of ascending aorta
estimated 38 mm.

Three vessel arch.  Branch vessels are patent.

Minimal atherosclerosis of the descending aorta. Diameter estimated
at the hiatus 2.4 cm.

Pulmonary arteries:

No central, lobar, segmental, or proximal subsegmental filling
defects.

Mediastinum/Nodes: No mediastinal adenopathy. Unremarkable
appearance of the thoracic esophagus.

Unremarkable appearance of the thoracic inlet and thyroid.

Lungs/Pleura: Central airways are clear. No pleural effusion. No
confluent airspace disease.

No pneumothorax.

Upper Abdomen: Diffuse fatty infiltration of liver parenchyma.

Musculoskeletal: No acute displaced fracture. Degenerative changes
of the spine.

Review of the MIP images confirms the above findings.
IMPRESSION: No acute CT finding.

Greatest diameter of the ascending aorta measures 38 mm.

Aortic Atherosclerosis ([id number]). Associated coronary
atherosclerosis.

Liver steatosis

## 2021-08-13 ENCOUNTER — Other Ambulatory Visit (HOSPITAL_COMMUNITY): Payer: Self-pay

## 2021-08-13 MED FILL — Tadalafil Tab 5 MG: ORAL | 90 days supply | Qty: 90 | Fill #2 | Status: AC

## 2021-08-15 ENCOUNTER — Other Ambulatory Visit (HOSPITAL_COMMUNITY): Payer: Self-pay

## 2021-08-15 MED ORDER — TESTOSTERONE CYPIONATE 200 MG/ML IM SOLN
INTRAMUSCULAR | 0 refills | Status: DC
  Filled 2021-08-15: qty 2, 28d supply, fill #0
  Filled 2021-09-09 – 2021-09-26 (×2): qty 2, 28d supply, fill #1

## 2021-08-15 MED ORDER — "EASY TOUCH HYPODERMIC NEEDLE 18G X 1-1/2"" MISC"
0 refills | Status: DC
  Filled 2021-08-15: qty 26, 365d supply, fill #0

## 2021-08-15 MED ORDER — "BD LUER-LOK SYRINGE 23G X 1"" 3 ML MISC"
0 refills | Status: AC
  Filled 2021-08-15: qty 26, 365d supply, fill #0

## 2021-08-21 ENCOUNTER — Other Ambulatory Visit (HOSPITAL_COMMUNITY): Payer: Self-pay

## 2021-08-22 ENCOUNTER — Other Ambulatory Visit (HOSPITAL_COMMUNITY): Payer: Self-pay

## 2021-08-25 ENCOUNTER — Other Ambulatory Visit (HOSPITAL_COMMUNITY): Payer: Self-pay

## 2021-08-25 MED FILL — Amlodipine Besylate Tab 5 MG (Base Equivalent): ORAL | 90 days supply | Qty: 90 | Fill #2 | Status: AC

## 2021-08-27 ENCOUNTER — Other Ambulatory Visit (HOSPITAL_COMMUNITY): Payer: Self-pay

## 2021-08-27 DIAGNOSIS — E291 Testicular hypofunction: Secondary | ICD-10-CM | POA: Diagnosis not present

## 2021-08-27 DIAGNOSIS — R3914 Feeling of incomplete bladder emptying: Secondary | ICD-10-CM | POA: Diagnosis not present

## 2021-08-27 DIAGNOSIS — N529 Male erectile dysfunction, unspecified: Secondary | ICD-10-CM | POA: Diagnosis not present

## 2021-08-27 DIAGNOSIS — N401 Enlarged prostate with lower urinary tract symptoms: Secondary | ICD-10-CM | POA: Diagnosis not present

## 2021-08-27 DIAGNOSIS — R351 Nocturia: Secondary | ICD-10-CM | POA: Diagnosis not present

## 2021-08-27 MED ORDER — FINASTERIDE 5 MG PO TABS
5.0000 mg | ORAL_TABLET | Freq: Every day | ORAL | 3 refills | Status: AC
Start: 2021-08-27 — End: ?
  Filled 2021-08-27: qty 90, 90d supply, fill #0

## 2021-09-09 ENCOUNTER — Other Ambulatory Visit (HOSPITAL_COMMUNITY): Payer: Self-pay

## 2021-09-13 ENCOUNTER — Other Ambulatory Visit (HOSPITAL_COMMUNITY): Payer: Self-pay

## 2021-09-18 ENCOUNTER — Other Ambulatory Visit (HOSPITAL_COMMUNITY): Payer: Self-pay

## 2021-09-26 ENCOUNTER — Other Ambulatory Visit (HOSPITAL_COMMUNITY): Payer: Self-pay

## 2021-10-14 ENCOUNTER — Other Ambulatory Visit (HOSPITAL_COMMUNITY): Payer: Self-pay

## 2021-10-14 MED ORDER — TAMSULOSIN HCL 0.4 MG PO CAPS
0.4000 mg | ORAL_CAPSULE | Freq: Every day | ORAL | 3 refills | Status: AC
  Filled 2021-10-14: qty 30, 30d supply, fill #0
  Filled 2021-10-31: qty 30, 30d supply, fill #1

## 2021-10-28 ENCOUNTER — Other Ambulatory Visit (HOSPITAL_COMMUNITY): Payer: Self-pay

## 2021-10-28 MED ORDER — TAMSULOSIN HCL 0.4 MG PO CAPS
ORAL_CAPSULE | ORAL | 3 refills | Status: DC
  Filled 2021-10-28 – 2021-11-06 (×2): qty 180, 90d supply, fill #0
  Filled 2022-02-13: qty 180, 90d supply, fill #1
  Filled 2022-03-02 – 2022-06-10 (×3): qty 180, 90d supply, fill #2
  Filled 2022-09-24: qty 180, 90d supply, fill #3
  Filled ????-??-??: fill #0

## 2021-10-31 ENCOUNTER — Other Ambulatory Visit (HOSPITAL_COMMUNITY): Payer: Self-pay

## 2021-10-31 MED ORDER — "BD DISP NEEDLES 18G X 1-1/2"" MISC"
6 refills | Status: AC
  Filled 2021-10-31: qty 100, 90d supply, fill #0

## 2021-11-03 ENCOUNTER — Other Ambulatory Visit (HOSPITAL_COMMUNITY): Payer: Self-pay

## 2021-11-04 ENCOUNTER — Other Ambulatory Visit (HOSPITAL_COMMUNITY): Payer: Self-pay

## 2021-11-06 ENCOUNTER — Other Ambulatory Visit: Payer: Self-pay

## 2021-11-06 ENCOUNTER — Other Ambulatory Visit (HOSPITAL_COMMUNITY): Payer: Self-pay

## 2021-11-06 ENCOUNTER — Other Ambulatory Visit: Payer: Self-pay | Admitting: Family Medicine

## 2021-11-07 ENCOUNTER — Other Ambulatory Visit (HOSPITAL_COMMUNITY): Payer: Self-pay

## 2021-11-07 MED ORDER — TESTOSTERONE CYPIONATE 200 MG/ML IM SOLN
INTRAMUSCULAR | 0 refills | Status: AC
  Filled 2021-11-07: qty 2, 28d supply, fill #0

## 2021-11-13 ENCOUNTER — Other Ambulatory Visit (HOSPITAL_COMMUNITY): Payer: Self-pay

## 2021-11-17 ENCOUNTER — Other Ambulatory Visit (HOSPITAL_COMMUNITY): Payer: Self-pay

## 2021-11-18 ENCOUNTER — Other Ambulatory Visit (HOSPITAL_COMMUNITY): Payer: Self-pay

## 2021-11-19 ENCOUNTER — Other Ambulatory Visit (HOSPITAL_COMMUNITY): Payer: Self-pay

## 2021-11-19 ENCOUNTER — Other Ambulatory Visit: Payer: Self-pay | Admitting: Family Medicine

## 2021-11-20 ENCOUNTER — Other Ambulatory Visit (HOSPITAL_COMMUNITY): Payer: Self-pay

## 2021-11-21 ENCOUNTER — Other Ambulatory Visit (HOSPITAL_COMMUNITY): Payer: Self-pay

## 2021-11-21 MED ORDER — VALSARTAN 80 MG PO TABS
ORAL_TABLET | ORAL | 3 refills | Status: AC
  Filled 2021-11-21: qty 90, 90d supply, fill #0
  Filled 2022-03-12: qty 90, 90d supply, fill #1
  Filled 2022-06-24: qty 90, 90d supply, fill #2
  Filled 2022-10-14: qty 90, 90d supply, fill #3

## 2021-11-21 MED ORDER — FUROSEMIDE 20 MG PO TABS
ORAL_TABLET | ORAL | 3 refills | Status: DC
  Filled 2021-11-21: qty 90, 90d supply, fill #0
  Filled 2022-06-24: qty 90, 90d supply, fill #1
  Filled 2022-10-14: qty 90, 90d supply, fill #2

## 2021-11-21 MED ORDER — SIMVASTATIN 40 MG PO TABS
ORAL_TABLET | ORAL | 3 refills | Status: DC
  Filled 2021-11-21: qty 90, 90d supply, fill #0
  Filled 2022-04-22: qty 90, 90d supply, fill #1

## 2021-11-27 DIAGNOSIS — I10 Essential (primary) hypertension: Secondary | ICD-10-CM | POA: Diagnosis not present

## 2021-11-27 DIAGNOSIS — R7989 Other specified abnormal findings of blood chemistry: Secondary | ICD-10-CM | POA: Diagnosis not present

## 2021-11-27 DIAGNOSIS — E559 Vitamin D deficiency, unspecified: Secondary | ICD-10-CM | POA: Diagnosis not present

## 2021-11-27 DIAGNOSIS — Z125 Encounter for screening for malignant neoplasm of prostate: Secondary | ICD-10-CM | POA: Diagnosis not present

## 2021-11-27 DIAGNOSIS — E785 Hyperlipidemia, unspecified: Secondary | ICD-10-CM | POA: Diagnosis not present

## 2021-11-27 DIAGNOSIS — R7303 Prediabetes: Secondary | ICD-10-CM | POA: Diagnosis not present

## 2021-12-04 ENCOUNTER — Other Ambulatory Visit (HOSPITAL_COMMUNITY): Payer: Self-pay

## 2021-12-04 DIAGNOSIS — R7303 Prediabetes: Secondary | ICD-10-CM | POA: Diagnosis not present

## 2021-12-04 DIAGNOSIS — E559 Vitamin D deficiency, unspecified: Secondary | ICD-10-CM | POA: Diagnosis not present

## 2021-12-04 DIAGNOSIS — J019 Acute sinusitis, unspecified: Secondary | ICD-10-CM | POA: Diagnosis not present

## 2021-12-04 DIAGNOSIS — R7989 Other specified abnormal findings of blood chemistry: Secondary | ICD-10-CM | POA: Diagnosis not present

## 2021-12-04 MED ORDER — AMOXICILLIN-POT CLAVULANATE 875-125 MG PO TABS
ORAL_TABLET | ORAL | 0 refills | Status: AC
  Filled 2021-12-04: qty 20, 10d supply, fill #0

## 2021-12-04 MED ORDER — TESTOSTERONE CYPIONATE 200 MG/ML IM SOLN
INTRAMUSCULAR | 0 refills | Status: DC
  Filled 2021-12-04: qty 2, 28d supply, fill #0
  Filled 2022-01-22: qty 2, 28d supply, fill #1

## 2021-12-04 MED ORDER — PREDNISONE 10 MG (21) PO TBPK
ORAL_TABLET | ORAL | 0 refills | Status: AC
  Filled 2021-12-04: qty 21, 6d supply, fill #0

## 2021-12-04 MED ORDER — OZEMPIC (2 MG/DOSE) 8 MG/3ML ~~LOC~~ SOPN
PEN_INJECTOR | SUBCUTANEOUS | 3 refills | Status: DC
  Filled 2021-12-04: qty 9, 84d supply, fill #0
  Filled 2022-03-09: qty 9, 84d supply, fill #1
  Filled 2022-06-10: qty 9, 84d supply, fill #2
  Filled 2022-10-07: qty 3, 28d supply, fill #3
  Filled 2022-12-04: qty 3, 28d supply, fill #4

## 2021-12-17 DIAGNOSIS — H905 Unspecified sensorineural hearing loss: Secondary | ICD-10-CM | POA: Diagnosis not present

## 2021-12-21 ENCOUNTER — Other Ambulatory Visit: Payer: Self-pay

## 2021-12-27 ENCOUNTER — Other Ambulatory Visit (HOSPITAL_COMMUNITY): Payer: Self-pay

## 2021-12-30 ENCOUNTER — Other Ambulatory Visit (HOSPITAL_COMMUNITY): Payer: Self-pay

## 2022-01-08 ENCOUNTER — Other Ambulatory Visit: Payer: Self-pay | Admitting: Family Medicine

## 2022-01-08 ENCOUNTER — Other Ambulatory Visit (HOSPITAL_COMMUNITY): Payer: Self-pay

## 2022-01-09 ENCOUNTER — Other Ambulatory Visit (HOSPITAL_COMMUNITY): Payer: Self-pay

## 2022-01-09 MED ORDER — AMLODIPINE BESYLATE 5 MG PO TABS
5.0000 mg | ORAL_TABLET | Freq: Every day | ORAL | 3 refills | Status: DC
  Filled 2022-01-09: qty 90, 90d supply, fill #0
  Filled 2022-04-22: qty 90, 90d supply, fill #1
  Filled 2022-08-26: qty 90, 90d supply, fill #2
  Filled 2022-12-04: qty 90, 90d supply, fill #3

## 2022-01-09 MED ORDER — TADALAFIL 5 MG PO TABS
ORAL_TABLET | ORAL | 3 refills | Status: DC
  Filled 2022-01-09: qty 90, 90d supply, fill #0
  Filled 2022-04-22: qty 90, 90d supply, fill #1
  Filled 2022-08-26: qty 90, 90d supply, fill #2
  Filled 2022-12-04: qty 90, 90d supply, fill #3

## 2022-01-22 ENCOUNTER — Other Ambulatory Visit (HOSPITAL_COMMUNITY): Payer: Self-pay

## 2022-02-13 ENCOUNTER — Other Ambulatory Visit (HOSPITAL_COMMUNITY): Payer: Self-pay

## 2022-03-02 ENCOUNTER — Other Ambulatory Visit (HOSPITAL_COMMUNITY): Payer: Self-pay

## 2022-03-02 ENCOUNTER — Other Ambulatory Visit: Payer: Self-pay | Admitting: Family Medicine

## 2022-03-03 ENCOUNTER — Other Ambulatory Visit (HOSPITAL_COMMUNITY): Payer: Self-pay

## 2022-03-03 ENCOUNTER — Other Ambulatory Visit: Payer: Self-pay

## 2022-03-07 ENCOUNTER — Other Ambulatory Visit (HOSPITAL_COMMUNITY): Payer: Self-pay

## 2022-03-07 MED ORDER — TESTOSTERONE CYPIONATE 200 MG/ML IM SOLN
200.0000 mg | INTRAMUSCULAR | 3 refills | Status: AC
  Filled 2022-03-07: qty 2, 28d supply, fill #0
  Filled 2022-03-25: qty 2, 28d supply, fill #1
  Filled 2022-04-22 – 2022-04-25 (×2): qty 2, 28d supply, fill #2
  Filled 2022-06-10: qty 2, 28d supply, fill #3
  Filled 2022-08-26: qty 2, 28d supply, fill #4

## 2022-03-10 ENCOUNTER — Other Ambulatory Visit (HOSPITAL_COMMUNITY): Payer: Self-pay

## 2022-03-12 ENCOUNTER — Other Ambulatory Visit: Payer: Self-pay

## 2022-03-12 ENCOUNTER — Other Ambulatory Visit (HOSPITAL_COMMUNITY): Payer: Self-pay

## 2022-03-12 DIAGNOSIS — R0789 Other chest pain: Secondary | ICD-10-CM | POA: Diagnosis not present

## 2022-03-12 DIAGNOSIS — E785 Hyperlipidemia, unspecified: Secondary | ICD-10-CM | POA: Diagnosis not present

## 2022-03-12 DIAGNOSIS — I10 Essential (primary) hypertension: Secondary | ICD-10-CM | POA: Diagnosis not present

## 2022-03-12 DIAGNOSIS — R609 Edema, unspecified: Secondary | ICD-10-CM | POA: Diagnosis not present

## 2022-03-12 DIAGNOSIS — I498 Other specified cardiac arrhythmias: Secondary | ICD-10-CM | POA: Diagnosis not present

## 2022-03-12 DIAGNOSIS — I493 Ventricular premature depolarization: Secondary | ICD-10-CM | POA: Diagnosis not present

## 2022-03-12 MED ORDER — ALLOPURINOL 100 MG PO TABS
ORAL_TABLET | ORAL | 3 refills | Status: AC
  Filled 2022-03-12: qty 90, 90d supply, fill #0
  Filled 2022-06-24: qty 90, 90d supply, fill #1
  Filled 2022-10-14: qty 90, 90d supply, fill #2
  Filled 2023-02-02: qty 90, 90d supply, fill #3

## 2022-03-13 ENCOUNTER — Other Ambulatory Visit (HOSPITAL_COMMUNITY): Payer: Self-pay

## 2022-03-19 DIAGNOSIS — R7303 Prediabetes: Secondary | ICD-10-CM | POA: Diagnosis not present

## 2022-03-19 DIAGNOSIS — R0789 Other chest pain: Secondary | ICD-10-CM | POA: Diagnosis not present

## 2022-03-19 DIAGNOSIS — R0600 Dyspnea, unspecified: Secondary | ICD-10-CM | POA: Diagnosis not present

## 2022-03-21 ENCOUNTER — Other Ambulatory Visit (HOSPITAL_COMMUNITY): Payer: Self-pay

## 2022-03-25 ENCOUNTER — Other Ambulatory Visit (HOSPITAL_COMMUNITY): Payer: Self-pay

## 2022-03-31 ENCOUNTER — Other Ambulatory Visit (HOSPITAL_COMMUNITY): Payer: Self-pay

## 2022-04-16 DIAGNOSIS — M109 Gout, unspecified: Secondary | ICD-10-CM | POA: Diagnosis not present

## 2022-04-16 DIAGNOSIS — E538 Deficiency of other specified B group vitamins: Secondary | ICD-10-CM | POA: Diagnosis not present

## 2022-04-16 DIAGNOSIS — Z23 Encounter for immunization: Secondary | ICD-10-CM | POA: Diagnosis not present

## 2022-04-16 DIAGNOSIS — E119 Type 2 diabetes mellitus without complications: Secondary | ICD-10-CM | POA: Diagnosis not present

## 2022-04-16 DIAGNOSIS — I10 Essential (primary) hypertension: Secondary | ICD-10-CM | POA: Diagnosis not present

## 2022-04-16 DIAGNOSIS — N400 Enlarged prostate without lower urinary tract symptoms: Secondary | ICD-10-CM | POA: Diagnosis not present

## 2022-04-16 DIAGNOSIS — R609 Edema, unspecified: Secondary | ICD-10-CM | POA: Diagnosis not present

## 2022-04-16 DIAGNOSIS — E291 Testicular hypofunction: Secondary | ICD-10-CM | POA: Diagnosis not present

## 2022-04-16 DIAGNOSIS — E785 Hyperlipidemia, unspecified: Secondary | ICD-10-CM | POA: Diagnosis not present

## 2022-04-16 DIAGNOSIS — G4730 Sleep apnea, unspecified: Secondary | ICD-10-CM | POA: Diagnosis not present

## 2022-04-22 ENCOUNTER — Other Ambulatory Visit (HOSPITAL_COMMUNITY): Payer: Self-pay

## 2022-04-25 ENCOUNTER — Other Ambulatory Visit (HOSPITAL_COMMUNITY): Payer: Self-pay

## 2022-04-29 DIAGNOSIS — E538 Deficiency of other specified B group vitamins: Secondary | ICD-10-CM | POA: Diagnosis not present

## 2022-04-29 DIAGNOSIS — E559 Vitamin D deficiency, unspecified: Secondary | ICD-10-CM | POA: Diagnosis not present

## 2022-04-29 DIAGNOSIS — E291 Testicular hypofunction: Secondary | ICD-10-CM | POA: Diagnosis not present

## 2022-04-29 DIAGNOSIS — E119 Type 2 diabetes mellitus without complications: Secondary | ICD-10-CM | POA: Diagnosis not present

## 2022-04-29 DIAGNOSIS — I10 Essential (primary) hypertension: Secondary | ICD-10-CM | POA: Diagnosis not present

## 2022-04-29 DIAGNOSIS — E785 Hyperlipidemia, unspecified: Secondary | ICD-10-CM | POA: Diagnosis not present

## 2022-05-11 ENCOUNTER — Other Ambulatory Visit (HOSPITAL_COMMUNITY): Payer: Self-pay

## 2022-05-11 DIAGNOSIS — E291 Testicular hypofunction: Secondary | ICD-10-CM | POA: Diagnosis not present

## 2022-05-11 DIAGNOSIS — E785 Hyperlipidemia, unspecified: Secondary | ICD-10-CM | POA: Diagnosis not present

## 2022-05-11 DIAGNOSIS — I10 Essential (primary) hypertension: Secondary | ICD-10-CM | POA: Diagnosis not present

## 2022-05-11 DIAGNOSIS — R609 Edema, unspecified: Secondary | ICD-10-CM | POA: Diagnosis not present

## 2022-05-11 DIAGNOSIS — N400 Enlarged prostate without lower urinary tract symptoms: Secondary | ICD-10-CM | POA: Diagnosis not present

## 2022-05-11 DIAGNOSIS — G4730 Sleep apnea, unspecified: Secondary | ICD-10-CM | POA: Diagnosis not present

## 2022-05-11 DIAGNOSIS — E538 Deficiency of other specified B group vitamins: Secondary | ICD-10-CM | POA: Diagnosis not present

## 2022-05-11 DIAGNOSIS — E119 Type 2 diabetes mellitus without complications: Secondary | ICD-10-CM | POA: Diagnosis not present

## 2022-05-11 DIAGNOSIS — M109 Gout, unspecified: Secondary | ICD-10-CM | POA: Diagnosis not present

## 2022-05-11 MED ORDER — ROSUVASTATIN CALCIUM 20 MG PO TABS
20.0000 mg | ORAL_TABLET | Freq: Every evening | ORAL | 1 refills | Status: DC
  Filled 2022-05-11: qty 90, 90d supply, fill #0
  Filled 2022-08-26: qty 90, 90d supply, fill #1

## 2022-05-27 DIAGNOSIS — Z961 Presence of intraocular lens: Secondary | ICD-10-CM | POA: Diagnosis not present

## 2022-05-27 DIAGNOSIS — H25812 Combined forms of age-related cataract, left eye: Secondary | ICD-10-CM | POA: Diagnosis not present

## 2022-05-27 DIAGNOSIS — E119 Type 2 diabetes mellitus without complications: Secondary | ICD-10-CM | POA: Diagnosis not present

## 2022-05-27 DIAGNOSIS — H524 Presbyopia: Secondary | ICD-10-CM | POA: Diagnosis not present

## 2022-06-07 DIAGNOSIS — R0789 Other chest pain: Secondary | ICD-10-CM | POA: Diagnosis not present

## 2022-06-07 DIAGNOSIS — E785 Hyperlipidemia, unspecified: Secondary | ICD-10-CM | POA: Diagnosis not present

## 2022-06-07 DIAGNOSIS — R0602 Shortness of breath: Secondary | ICD-10-CM | POA: Diagnosis not present

## 2022-06-07 DIAGNOSIS — I10 Essential (primary) hypertension: Secondary | ICD-10-CM | POA: Diagnosis not present

## 2022-06-08 DIAGNOSIS — Z9989 Dependence on other enabling machines and devices: Secondary | ICD-10-CM | POA: Diagnosis not present

## 2022-06-08 DIAGNOSIS — R079 Chest pain, unspecified: Secondary | ICD-10-CM | POA: Diagnosis not present

## 2022-06-08 DIAGNOSIS — Z7982 Long term (current) use of aspirin: Secondary | ICD-10-CM | POA: Diagnosis not present

## 2022-06-08 DIAGNOSIS — Z79899 Other long term (current) drug therapy: Secondary | ICD-10-CM | POA: Diagnosis not present

## 2022-06-08 DIAGNOSIS — R7989 Other specified abnormal findings of blood chemistry: Secondary | ICD-10-CM | POA: Diagnosis not present

## 2022-06-08 DIAGNOSIS — G4733 Obstructive sleep apnea (adult) (pediatric): Secondary | ICD-10-CM | POA: Diagnosis not present

## 2022-06-08 DIAGNOSIS — I2584 Coronary atherosclerosis due to calcified coronary lesion: Secondary | ICD-10-CM | POA: Diagnosis not present

## 2022-06-08 DIAGNOSIS — Z87891 Personal history of nicotine dependence: Secondary | ICD-10-CM | POA: Diagnosis not present

## 2022-06-08 DIAGNOSIS — I10 Essential (primary) hypertension: Secondary | ICD-10-CM | POA: Diagnosis not present

## 2022-06-08 DIAGNOSIS — I25110 Atherosclerotic heart disease of native coronary artery with unstable angina pectoris: Secondary | ICD-10-CM | POA: Diagnosis not present

## 2022-06-08 DIAGNOSIS — I200 Unstable angina: Secondary | ICD-10-CM | POA: Diagnosis not present

## 2022-06-08 DIAGNOSIS — R0789 Other chest pain: Secondary | ICD-10-CM | POA: Diagnosis not present

## 2022-06-08 DIAGNOSIS — E785 Hyperlipidemia, unspecified: Secondary | ICD-10-CM | POA: Diagnosis not present

## 2022-06-08 DIAGNOSIS — E119 Type 2 diabetes mellitus without complications: Secondary | ICD-10-CM | POA: Diagnosis not present

## 2022-06-09 ENCOUNTER — Other Ambulatory Visit (HOSPITAL_COMMUNITY): Payer: Self-pay

## 2022-06-09 DIAGNOSIS — E119 Type 2 diabetes mellitus without complications: Secondary | ICD-10-CM | POA: Diagnosis not present

## 2022-06-09 DIAGNOSIS — I200 Unstable angina: Secondary | ICD-10-CM | POA: Diagnosis not present

## 2022-06-09 DIAGNOSIS — Z9989 Dependence on other enabling machines and devices: Secondary | ICD-10-CM | POA: Diagnosis not present

## 2022-06-09 DIAGNOSIS — Z87891 Personal history of nicotine dependence: Secondary | ICD-10-CM | POA: Diagnosis not present

## 2022-06-09 DIAGNOSIS — E785 Hyperlipidemia, unspecified: Secondary | ICD-10-CM | POA: Diagnosis not present

## 2022-06-09 DIAGNOSIS — G4733 Obstructive sleep apnea (adult) (pediatric): Secondary | ICD-10-CM | POA: Diagnosis not present

## 2022-06-09 DIAGNOSIS — Z7982 Long term (current) use of aspirin: Secondary | ICD-10-CM | POA: Diagnosis not present

## 2022-06-09 DIAGNOSIS — R0789 Other chest pain: Secondary | ICD-10-CM | POA: Diagnosis not present

## 2022-06-09 DIAGNOSIS — R079 Chest pain, unspecified: Secondary | ICD-10-CM | POA: Diagnosis not present

## 2022-06-09 DIAGNOSIS — I10 Essential (primary) hypertension: Secondary | ICD-10-CM | POA: Diagnosis not present

## 2022-06-09 DIAGNOSIS — Z79899 Other long term (current) drug therapy: Secondary | ICD-10-CM | POA: Diagnosis not present

## 2022-06-09 MED ORDER — METOPROLOL SUCCINATE ER 25 MG PO TB24
12.5000 mg | ORAL_TABLET | Freq: Every day | ORAL | 2 refills | Status: AC
  Filled 2022-06-09: qty 15, 30d supply, fill #0

## 2022-06-09 MED ORDER — METFORMIN HCL ER 500 MG PO TB24
500.0000 mg | ORAL_TABLET | Freq: Every day | ORAL | 3 refills | Status: AC
  Filled 2022-06-09: qty 90, 90d supply, fill #0

## 2022-06-10 ENCOUNTER — Other Ambulatory Visit (HOSPITAL_COMMUNITY): Payer: Self-pay

## 2022-06-11 DIAGNOSIS — R0609 Other forms of dyspnea: Secondary | ICD-10-CM | POA: Diagnosis not present

## 2022-06-11 DIAGNOSIS — R609 Edema, unspecified: Secondary | ICD-10-CM | POA: Diagnosis not present

## 2022-06-11 DIAGNOSIS — R002 Palpitations: Secondary | ICD-10-CM | POA: Diagnosis not present

## 2022-06-11 DIAGNOSIS — R0789 Other chest pain: Secondary | ICD-10-CM | POA: Diagnosis not present

## 2022-06-11 DIAGNOSIS — Z6841 Body Mass Index (BMI) 40.0 and over, adult: Secondary | ICD-10-CM | POA: Diagnosis not present

## 2022-06-11 DIAGNOSIS — E119 Type 2 diabetes mellitus without complications: Secondary | ICD-10-CM | POA: Diagnosis not present

## 2022-06-11 DIAGNOSIS — E669 Obesity, unspecified: Secondary | ICD-10-CM | POA: Diagnosis not present

## 2022-06-11 DIAGNOSIS — I10 Essential (primary) hypertension: Secondary | ICD-10-CM | POA: Diagnosis not present

## 2022-06-24 ENCOUNTER — Other Ambulatory Visit (HOSPITAL_COMMUNITY): Payer: Self-pay

## 2022-06-25 ENCOUNTER — Other Ambulatory Visit (HOSPITAL_COMMUNITY): Payer: Self-pay

## 2022-07-23 ENCOUNTER — Encounter: Payer: 59 | Admitting: Skilled Nursing Facility1

## 2022-07-23 ENCOUNTER — Institutional Professional Consult (permissible substitution) (HOSPITAL_BASED_OUTPATIENT_CLINIC_OR_DEPARTMENT_OTHER): Payer: 59 | Admitting: Pulmonary Disease

## 2022-08-03 ENCOUNTER — Other Ambulatory Visit (HOSPITAL_COMMUNITY): Payer: Self-pay

## 2022-08-26 ENCOUNTER — Other Ambulatory Visit (HOSPITAL_COMMUNITY): Payer: Self-pay

## 2022-08-26 ENCOUNTER — Encounter (HOSPITAL_COMMUNITY): Payer: Self-pay

## 2022-08-31 ENCOUNTER — Other Ambulatory Visit: Payer: Self-pay

## 2022-08-31 DIAGNOSIS — E785 Hyperlipidemia, unspecified: Secondary | ICD-10-CM | POA: Diagnosis not present

## 2022-09-02 DIAGNOSIS — N398 Other specified disorders of urinary system: Secondary | ICD-10-CM | POA: Diagnosis not present

## 2022-09-02 DIAGNOSIS — Z125 Encounter for screening for malignant neoplasm of prostate: Secondary | ICD-10-CM | POA: Diagnosis not present

## 2022-09-05 ENCOUNTER — Other Ambulatory Visit (HOSPITAL_COMMUNITY): Payer: Self-pay

## 2022-09-08 ENCOUNTER — Other Ambulatory Visit (HOSPITAL_COMMUNITY): Payer: Self-pay

## 2022-09-08 MED ORDER — ROSUVASTATIN CALCIUM 20 MG PO TABS
20.0000 mg | ORAL_TABLET | Freq: Every day | ORAL | 3 refills | Status: DC
  Filled 2022-12-04: qty 90, 90d supply, fill #0
  Filled 2023-03-10: qty 90, 90d supply, fill #1
  Filled 2023-06-06: qty 90, 90d supply, fill #2
  Filled 2023-08-27: qty 90, 90d supply, fill #3

## 2022-09-24 ENCOUNTER — Other Ambulatory Visit: Payer: Self-pay

## 2022-09-24 ENCOUNTER — Other Ambulatory Visit (HOSPITAL_COMMUNITY): Payer: Self-pay

## 2022-09-30 ENCOUNTER — Other Ambulatory Visit (HOSPITAL_COMMUNITY): Payer: Self-pay

## 2022-09-30 MED ORDER — TESTOSTERONE CYPIONATE 200 MG/ML IM SOLN
200.0000 mg | INTRAMUSCULAR | 5 refills | Status: AC
  Filled 2022-09-30: qty 2, 28d supply, fill #0
  Filled 2022-11-05: qty 2, 28d supply, fill #1
  Filled 2022-12-04: qty 2, 28d supply, fill #2
  Filled 2023-02-02: qty 2, 28d supply, fill #3
  Filled 2023-03-10: qty 2, 28d supply, fill #4

## 2022-10-07 ENCOUNTER — Other Ambulatory Visit (HOSPITAL_COMMUNITY): Payer: Self-pay

## 2022-10-08 ENCOUNTER — Other Ambulatory Visit (HOSPITAL_COMMUNITY): Payer: Self-pay

## 2022-10-08 ENCOUNTER — Other Ambulatory Visit: Payer: Self-pay

## 2022-10-08 ENCOUNTER — Encounter (HOSPITAL_COMMUNITY): Payer: Self-pay

## 2022-10-13 ENCOUNTER — Other Ambulatory Visit (HOSPITAL_COMMUNITY): Payer: Self-pay

## 2022-10-14 ENCOUNTER — Other Ambulatory Visit: Payer: Self-pay

## 2022-10-26 ENCOUNTER — Other Ambulatory Visit (HOSPITAL_COMMUNITY): Payer: Self-pay

## 2022-10-28 ENCOUNTER — Other Ambulatory Visit (HOSPITAL_COMMUNITY): Payer: Self-pay

## 2022-11-06 ENCOUNTER — Other Ambulatory Visit (HOSPITAL_COMMUNITY): Payer: Self-pay

## 2022-11-06 ENCOUNTER — Other Ambulatory Visit: Payer: Self-pay

## 2022-12-04 ENCOUNTER — Other Ambulatory Visit: Payer: Self-pay

## 2022-12-18 ENCOUNTER — Other Ambulatory Visit (HOSPITAL_COMMUNITY): Payer: Self-pay

## 2022-12-25 ENCOUNTER — Other Ambulatory Visit (HOSPITAL_COMMUNITY): Payer: Self-pay

## 2022-12-30 ENCOUNTER — Other Ambulatory Visit (HOSPITAL_COMMUNITY): Payer: Self-pay

## 2023-01-04 ENCOUNTER — Other Ambulatory Visit (HOSPITAL_COMMUNITY): Payer: Self-pay

## 2023-01-05 ENCOUNTER — Other Ambulatory Visit (HOSPITAL_COMMUNITY): Payer: Self-pay

## 2023-01-05 MED ORDER — TAMSULOSIN HCL 0.4 MG PO CAPS
0.8000 mg | ORAL_CAPSULE | Freq: Every day | ORAL | 3 refills | Status: DC
  Filled 2023-01-05: qty 180, 90d supply, fill #0
  Filled 2023-04-08: qty 180, 90d supply, fill #1
  Filled 2023-07-07: qty 180, 90d supply, fill #2
  Filled 2023-10-13: qty 180, 90d supply, fill #3

## 2023-01-05 MED ORDER — INDOMETHACIN 50 MG PO CAPS
50.0000 mg | ORAL_CAPSULE | Freq: Three times a day (TID) | ORAL | 0 refills | Status: AC | PRN
  Filled 2023-01-05: qty 30, 10d supply, fill #0

## 2023-01-05 MED ORDER — FUROSEMIDE 20 MG PO TABS
20.0000 mg | ORAL_TABLET | Freq: Every day | ORAL | 0 refills | Status: DC
  Filled 2023-01-05: qty 90, 90d supply, fill #0

## 2023-01-11 ENCOUNTER — Other Ambulatory Visit (HOSPITAL_COMMUNITY): Payer: Self-pay

## 2023-02-02 ENCOUNTER — Other Ambulatory Visit (HOSPITAL_COMMUNITY): Payer: Self-pay

## 2023-02-02 ENCOUNTER — Other Ambulatory Visit: Payer: Self-pay | Admitting: Oncology

## 2023-02-02 DIAGNOSIS — Z006 Encounter for examination for normal comparison and control in clinical research program: Secondary | ICD-10-CM

## 2023-02-03 ENCOUNTER — Other Ambulatory Visit: Payer: Self-pay

## 2023-02-03 ENCOUNTER — Other Ambulatory Visit (HOSPITAL_COMMUNITY): Payer: Self-pay

## 2023-02-03 MED ORDER — VALSARTAN 80 MG PO TABS
80.0000 mg | ORAL_TABLET | Freq: Every day | ORAL | 0 refills | Status: DC
  Filled 2023-02-03: qty 90, 90d supply, fill #0

## 2023-02-03 MED ORDER — OZEMPIC (2 MG/DOSE) 8 MG/3ML ~~LOC~~ SOPN
2.0000 mg | PEN_INJECTOR | SUBCUTANEOUS | 0 refills | Status: AC
  Filled 2023-02-03 – 2023-03-30 (×2): qty 9, 84d supply, fill #0

## 2023-02-04 ENCOUNTER — Other Ambulatory Visit (HOSPITAL_COMMUNITY): Payer: Self-pay

## 2023-02-04 MED ORDER — OZEMPIC (2 MG/DOSE) 8 MG/3ML ~~LOC~~ SOPN
2.0000 mg | PEN_INJECTOR | SUBCUTANEOUS | 3 refills | Status: AC
  Filled 2023-02-04: qty 3, 28d supply, fill #0
  Filled 2023-06-29: qty 3, 28d supply, fill #1
  Filled 2023-08-23: qty 3, 28d supply, fill #2
  Filled 2023-10-05: qty 3, 28d supply, fill #3
  Filled 2024-01-04: qty 3, 28d supply, fill #4

## 2023-02-04 MED ORDER — VALSARTAN 80 MG PO TABS
80.0000 mg | ORAL_TABLET | Freq: Every day | ORAL | 1 refills | Status: AC
  Filled 2023-02-04 – 2023-04-26 (×2): qty 90, 90d supply, fill #0
  Filled 2023-09-13: qty 90, 90d supply, fill #1

## 2023-02-14 ENCOUNTER — Other Ambulatory Visit
Admission: RE | Admit: 2023-02-14 | Discharge: 2023-02-14 | Disposition: A | Discharge status: Home / Self Care | Payer: Commercial Managed Care - PPO | Attending: Oncology | Admitting: Oncology

## 2023-02-14 DIAGNOSIS — Z006 Encounter for examination for normal comparison and control in clinical research program: Secondary | ICD-10-CM | POA: Insufficient documentation

## 2023-03-10 ENCOUNTER — Other Ambulatory Visit (HOSPITAL_COMMUNITY): Payer: Self-pay

## 2023-03-10 ENCOUNTER — Other Ambulatory Visit: Payer: Self-pay

## 2023-03-10 MED ORDER — AMLODIPINE BESYLATE 5 MG PO TABS
5.0000 mg | ORAL_TABLET | Freq: Every day | ORAL | 3 refills | Status: DC
  Filled 2023-03-10: qty 90, 90d supply, fill #0
  Filled 2023-06-06: qty 90, 90d supply, fill #1
  Filled 2023-08-27: qty 90, 90d supply, fill #2
  Filled 2023-12-06: qty 90, 90d supply, fill #3

## 2023-03-10 MED ORDER — TADALAFIL 5 MG PO TABS
5.0000 mg | ORAL_TABLET | Freq: Every day | ORAL | 3 refills | Status: DC
  Filled 2023-03-10: qty 90, 90d supply, fill #0
  Filled 2023-06-06: qty 90, 90d supply, fill #1
  Filled 2023-08-27: qty 90, 90d supply, fill #2

## 2023-03-11 ENCOUNTER — Other Ambulatory Visit: Payer: Self-pay

## 2023-03-11 ENCOUNTER — Other Ambulatory Visit (HOSPITAL_COMMUNITY): Payer: Self-pay

## 2023-03-23 ENCOUNTER — Other Ambulatory Visit (HOSPITAL_COMMUNITY): Payer: Self-pay

## 2023-03-23 MED ORDER — TESTOSTERONE CYPIONATE 200 MG/ML IM SOLN
150.0000 mg | INTRAMUSCULAR | 5 refills | Status: DC
  Filled 2023-03-30 – 2023-04-05 (×2): qty 2, 28d supply, fill #0
  Filled 2023-05-10: qty 2, 28d supply, fill #1
  Filled 2023-06-21: qty 2, 28d supply, fill #2
  Filled 2023-07-07 – 2023-07-17 (×2): qty 2, 28d supply, fill #3
  Filled 2023-08-23: qty 2, 28d supply, fill #4

## 2023-03-24 ENCOUNTER — Other Ambulatory Visit (HOSPITAL_COMMUNITY): Payer: Self-pay

## 2023-03-30 ENCOUNTER — Other Ambulatory Visit (HOSPITAL_COMMUNITY): Payer: Self-pay

## 2023-03-31 ENCOUNTER — Other Ambulatory Visit: Payer: Self-pay

## 2023-04-05 ENCOUNTER — Other Ambulatory Visit: Payer: Self-pay

## 2023-04-05 ENCOUNTER — Other Ambulatory Visit (HOSPITAL_COMMUNITY): Payer: Self-pay

## 2023-04-08 ENCOUNTER — Other Ambulatory Visit (HOSPITAL_COMMUNITY): Payer: Self-pay

## 2023-04-08 ENCOUNTER — Other Ambulatory Visit: Payer: Self-pay

## 2023-04-09 ENCOUNTER — Other Ambulatory Visit (HOSPITAL_COMMUNITY): Payer: Self-pay

## 2023-04-09 MED ORDER — FUROSEMIDE 20 MG PO TABS
20.0000 mg | ORAL_TABLET | Freq: Every day | ORAL | 0 refills | Status: DC
  Filled 2023-04-09: qty 90, 90d supply, fill #0

## 2023-04-26 ENCOUNTER — Other Ambulatory Visit: Payer: Self-pay

## 2023-04-26 ENCOUNTER — Other Ambulatory Visit (HOSPITAL_COMMUNITY): Payer: Self-pay

## 2023-05-10 ENCOUNTER — Other Ambulatory Visit (HOSPITAL_COMMUNITY): Payer: Self-pay

## 2023-05-11 ENCOUNTER — Other Ambulatory Visit: Payer: Self-pay

## 2023-06-07 ENCOUNTER — Other Ambulatory Visit (HOSPITAL_COMMUNITY): Payer: Self-pay

## 2023-06-07 ENCOUNTER — Other Ambulatory Visit: Payer: Self-pay

## 2023-06-15 ENCOUNTER — Other Ambulatory Visit: Payer: Self-pay

## 2023-06-15 ENCOUNTER — Other Ambulatory Visit (HOSPITAL_COMMUNITY): Payer: Self-pay

## 2023-06-15 MED ORDER — VALSARTAN 80 MG PO TABS
160.0000 mg | ORAL_TABLET | Freq: Every day | ORAL | 0 refills | Status: DC
  Filled 2023-06-15: qty 30, 15d supply, fill #0

## 2023-06-21 ENCOUNTER — Other Ambulatory Visit: Payer: Self-pay

## 2023-06-29 ENCOUNTER — Other Ambulatory Visit: Payer: Self-pay

## 2023-06-29 ENCOUNTER — Other Ambulatory Visit (HOSPITAL_COMMUNITY): Payer: Self-pay

## 2023-06-30 ENCOUNTER — Other Ambulatory Visit: Payer: Self-pay

## 2023-07-07 ENCOUNTER — Other Ambulatory Visit (HOSPITAL_COMMUNITY): Payer: Self-pay

## 2023-07-07 ENCOUNTER — Other Ambulatory Visit: Payer: Self-pay

## 2023-07-07 MED ORDER — FUROSEMIDE 20 MG PO TABS
20.0000 mg | ORAL_TABLET | Freq: Every day | ORAL | 0 refills | Status: DC
  Filled 2023-07-07: qty 90, 90d supply, fill #0

## 2023-07-14 ENCOUNTER — Other Ambulatory Visit (HOSPITAL_COMMUNITY): Payer: Self-pay

## 2023-07-20 ENCOUNTER — Other Ambulatory Visit: Payer: Self-pay

## 2023-08-24 ENCOUNTER — Other Ambulatory Visit: Payer: Self-pay

## 2023-08-27 ENCOUNTER — Other Ambulatory Visit: Payer: Self-pay

## 2023-08-27 ENCOUNTER — Other Ambulatory Visit (HOSPITAL_COMMUNITY): Payer: Self-pay

## 2023-09-13 ENCOUNTER — Other Ambulatory Visit (HOSPITAL_COMMUNITY): Payer: Self-pay

## 2023-09-16 ENCOUNTER — Other Ambulatory Visit (HOSPITAL_COMMUNITY): Payer: Self-pay

## 2023-10-05 ENCOUNTER — Other Ambulatory Visit: Payer: Self-pay

## 2023-10-05 ENCOUNTER — Other Ambulatory Visit (HOSPITAL_COMMUNITY): Payer: Self-pay

## 2023-10-05 MED ORDER — TESTOSTERONE CYPIONATE 200 MG/ML IM SOLN
150.0000 mg | INTRAMUSCULAR | 0 refills | Status: AC
  Filled 2023-10-05: qty 2, 28d supply, fill #0

## 2023-10-07 ENCOUNTER — Other Ambulatory Visit (HOSPITAL_COMMUNITY): Payer: Self-pay

## 2023-10-13 ENCOUNTER — Other Ambulatory Visit: Payer: Self-pay

## 2023-10-13 ENCOUNTER — Other Ambulatory Visit (HOSPITAL_COMMUNITY): Payer: Self-pay

## 2023-10-13 MED ORDER — FUROSEMIDE 20 MG PO TABS
20.0000 mg | ORAL_TABLET | Freq: Every day | ORAL | 1 refills | Status: DC
  Filled 2023-10-13: qty 90, 90d supply, fill #0
  Filled 2024-01-13: qty 90, 90d supply, fill #1

## 2023-10-14 ENCOUNTER — Other Ambulatory Visit: Payer: Self-pay

## 2023-10-15 ENCOUNTER — Other Ambulatory Visit (HOSPITAL_COMMUNITY): Payer: Self-pay

## 2023-10-15 MED ORDER — ALLOPURINOL 100 MG PO TABS
100.0000 mg | ORAL_TABLET | Freq: Every day | ORAL | 3 refills | Status: AC
  Filled 2023-10-15: qty 90, 90d supply, fill #0
  Filled 2024-01-13: qty 90, 90d supply, fill #1
  Filled 2024-04-26: qty 90, 90d supply, fill #2
  Filled 2024-08-08: qty 90, 90d supply, fill #3

## 2023-11-13 ENCOUNTER — Other Ambulatory Visit (HOSPITAL_COMMUNITY): Payer: Self-pay

## 2023-12-06 ENCOUNTER — Other Ambulatory Visit (HOSPITAL_COMMUNITY): Payer: Self-pay

## 2023-12-07 ENCOUNTER — Other Ambulatory Visit (HOSPITAL_COMMUNITY): Payer: Self-pay

## 2023-12-07 ENCOUNTER — Other Ambulatory Visit: Payer: Self-pay

## 2023-12-07 MED ORDER — VALSARTAN 80 MG PO TABS
160.0000 mg | ORAL_TABLET | Freq: Every day | ORAL | 0 refills | Status: DC
  Filled 2023-12-07: qty 30, 15d supply, fill #0

## 2023-12-07 MED ORDER — ROSUVASTATIN CALCIUM 20 MG PO TABS
20.0000 mg | ORAL_TABLET | Freq: Every day | ORAL | 0 refills | Status: DC
  Filled 2023-12-07: qty 90, 90d supply, fill #0

## 2023-12-08 ENCOUNTER — Other Ambulatory Visit (HOSPITAL_COMMUNITY): Payer: Self-pay

## 2023-12-08 ENCOUNTER — Other Ambulatory Visit: Payer: Self-pay

## 2024-01-04 ENCOUNTER — Other Ambulatory Visit (HOSPITAL_COMMUNITY): Payer: Self-pay

## 2024-01-05 ENCOUNTER — Other Ambulatory Visit: Payer: Self-pay

## 2024-01-05 ENCOUNTER — Other Ambulatory Visit (HOSPITAL_COMMUNITY): Payer: Self-pay

## 2024-01-05 MED ORDER — TESTOSTERONE CYPIONATE 200 MG/ML IM SOLN
150.0000 mg | INTRAMUSCULAR | 0 refills | Status: DC
  Filled 2024-01-05: qty 2, 28d supply, fill #0

## 2024-01-12 ENCOUNTER — Other Ambulatory Visit (HOSPITAL_COMMUNITY): Payer: Self-pay

## 2024-01-13 ENCOUNTER — Other Ambulatory Visit (HOSPITAL_COMMUNITY): Payer: Self-pay

## 2024-01-13 ENCOUNTER — Other Ambulatory Visit: Payer: Self-pay

## 2024-01-13 MED ORDER — VALSARTAN 80 MG PO TABS
160.0000 mg | ORAL_TABLET | Freq: Every day | ORAL | 0 refills | Status: DC
  Filled 2024-01-13: qty 30, 15d supply, fill #0

## 2024-01-21 ENCOUNTER — Other Ambulatory Visit: Payer: Self-pay

## 2024-01-21 ENCOUNTER — Other Ambulatory Visit (HOSPITAL_COMMUNITY): Payer: Self-pay

## 2024-01-21 MED ORDER — AMLODIPINE BESYLATE 5 MG PO TABS
5.0000 mg | ORAL_TABLET | Freq: Every day | ORAL | 3 refills | Status: AC
  Filled 2024-03-15: qty 90, 90d supply, fill #0
  Filled 2024-03-22: qty 90, 90d supply, fill #1

## 2024-01-21 MED ORDER — ROSUVASTATIN CALCIUM 20 MG PO TABS
20.0000 mg | ORAL_TABLET | Freq: Every day | ORAL | 0 refills | Status: DC
  Filled 2024-03-15: qty 90, 90d supply, fill #0

## 2024-01-21 MED ORDER — OZEMPIC (2 MG/DOSE) 8 MG/3ML ~~LOC~~ SOPN: 2.0000 mg | PEN_INJECTOR | SUBCUTANEOUS | 3 refills | Status: DC

## 2024-01-21 MED ORDER — VALSARTAN 80 MG PO TABS
80.0000 mg | ORAL_TABLET | Freq: Every day | ORAL | 1 refills | Status: DC
  Filled 2024-01-21: qty 90, 90d supply, fill #0
  Filled 2024-04-26: qty 90, 90d supply, fill #1

## 2024-01-24 ENCOUNTER — Other Ambulatory Visit (HOSPITAL_COMMUNITY): Payer: Self-pay

## 2024-01-31 ENCOUNTER — Other Ambulatory Visit (HOSPITAL_COMMUNITY): Payer: Self-pay

## 2024-02-01 ENCOUNTER — Other Ambulatory Visit: Payer: Self-pay

## 2024-02-01 ENCOUNTER — Other Ambulatory Visit (HOSPITAL_COMMUNITY): Payer: Self-pay

## 2024-02-01 MED ORDER — TAMSULOSIN HCL 0.4 MG PO CAPS
0.8000 mg | ORAL_CAPSULE | Freq: Every day | ORAL | 3 refills | Status: AC
  Filled 2024-02-01: qty 180, 90d supply, fill #0
  Filled 2024-04-26: qty 180, 90d supply, fill #1
  Filled 2024-08-01: qty 180, 90d supply, fill #2

## 2024-03-16 ENCOUNTER — Other Ambulatory Visit: Payer: Self-pay

## 2024-03-16 ENCOUNTER — Other Ambulatory Visit (HOSPITAL_COMMUNITY): Payer: Self-pay

## 2024-03-17 ENCOUNTER — Other Ambulatory Visit (HOSPITAL_COMMUNITY): Payer: Self-pay

## 2024-03-17 ENCOUNTER — Encounter (HOSPITAL_COMMUNITY): Payer: Self-pay

## 2024-03-17 MED ORDER — MOUNJARO 2.5 MG/0.5ML ~~LOC~~ SOAJ
2.5000 mg | SUBCUTANEOUS | 0 refills | Status: DC
Start: 2024-03-17 — End: 2024-04-10
  Filled 2024-03-17 – 2024-03-20 (×2): qty 2, 28d supply, fill #0

## 2024-03-20 ENCOUNTER — Other Ambulatory Visit (HOSPITAL_COMMUNITY): Payer: Self-pay

## 2024-03-22 ENCOUNTER — Other Ambulatory Visit (HOSPITAL_COMMUNITY): Payer: Self-pay

## 2024-03-22 ENCOUNTER — Other Ambulatory Visit: Payer: Self-pay

## 2024-03-22 MED ORDER — ROSUVASTATIN CALCIUM 20 MG PO TABS
20.0000 mg | ORAL_TABLET | Freq: Every day | ORAL | 3 refills | Status: AC
  Filled 2024-03-22 – 2024-03-23 (×2): qty 90, 90d supply, fill #0

## 2024-03-23 ENCOUNTER — Other Ambulatory Visit (HOSPITAL_COMMUNITY): Payer: Self-pay

## 2024-03-23 ENCOUNTER — Other Ambulatory Visit: Payer: Self-pay

## 2024-04-10 ENCOUNTER — Encounter (HOSPITAL_COMMUNITY): Payer: Self-pay

## 2024-04-10 ENCOUNTER — Other Ambulatory Visit (HOSPITAL_COMMUNITY): Payer: Self-pay

## 2024-04-10 MED ORDER — TIRZEPATIDE 2.5 MG/0.5ML ~~LOC~~ SOAJ
2.5000 mg | SUBCUTANEOUS | 5 refills | Status: AC
  Filled 2024-04-10: qty 2, 28d supply, fill #0

## 2024-04-11 ENCOUNTER — Other Ambulatory Visit (HOSPITAL_COMMUNITY): Payer: Self-pay

## 2024-04-11 ENCOUNTER — Other Ambulatory Visit: Payer: Self-pay

## 2024-04-13 ENCOUNTER — Other Ambulatory Visit: Payer: Self-pay

## 2024-04-13 ENCOUNTER — Other Ambulatory Visit (HOSPITAL_COMMUNITY): Payer: Self-pay

## 2024-04-13 ENCOUNTER — Other Ambulatory Visit (HOSPITAL_BASED_OUTPATIENT_CLINIC_OR_DEPARTMENT_OTHER): Payer: Self-pay

## 2024-04-24 ENCOUNTER — Other Ambulatory Visit: Payer: Self-pay

## 2024-04-24 ENCOUNTER — Other Ambulatory Visit (HOSPITAL_COMMUNITY): Payer: Self-pay

## 2024-04-24 MED ORDER — MOUNJARO 5 MG/0.5ML ~~LOC~~ SOAJ
5.0000 mg | SUBCUTANEOUS | 5 refills | Status: AC
  Filled 2024-04-24: qty 2, 28d supply, fill #0
  Filled 2024-05-23: qty 2, 28d supply, fill #1
  Filled 2024-06-19: qty 2, 28d supply, fill #2

## 2024-04-24 MED ORDER — TESTOSTERONE CYPIONATE 200 MG/ML IM SOLN
150.0000 mg | INTRAMUSCULAR | 5 refills | Status: AC
  Filled 2024-04-24: qty 2, 28d supply, fill #0
  Filled 2024-05-23: qty 2, 28d supply, fill #1
  Filled 2024-06-16 – 2024-06-20 (×2): qty 2, 28d supply, fill #2

## 2024-04-25 ENCOUNTER — Other Ambulatory Visit (HOSPITAL_COMMUNITY): Payer: Self-pay

## 2024-04-26 ENCOUNTER — Other Ambulatory Visit (HOSPITAL_COMMUNITY): Payer: Self-pay

## 2024-04-26 MED ORDER — FUROSEMIDE 20 MG PO TABS
20.0000 mg | ORAL_TABLET | Freq: Every day | ORAL | 1 refills | Status: AC
  Filled 2024-04-26: qty 90, 90d supply, fill #0
  Filled 2024-08-01: qty 90, 90d supply, fill #1

## 2024-04-27 ENCOUNTER — Other Ambulatory Visit: Payer: Self-pay

## 2024-04-27 ENCOUNTER — Other Ambulatory Visit (HOSPITAL_COMMUNITY): Payer: Self-pay

## 2024-05-23 ENCOUNTER — Other Ambulatory Visit (HOSPITAL_COMMUNITY): Payer: Self-pay

## 2024-05-23 ENCOUNTER — Other Ambulatory Visit: Payer: Self-pay

## 2024-06-16 ENCOUNTER — Other Ambulatory Visit (HOSPITAL_COMMUNITY): Payer: Self-pay

## 2024-06-19 ENCOUNTER — Other Ambulatory Visit (HOSPITAL_COMMUNITY): Payer: Self-pay

## 2024-06-20 ENCOUNTER — Other Ambulatory Visit (HOSPITAL_COMMUNITY): Payer: Self-pay

## 2024-07-12 ENCOUNTER — Other Ambulatory Visit: Payer: Self-pay

## 2024-07-12 ENCOUNTER — Other Ambulatory Visit (HOSPITAL_COMMUNITY): Payer: Self-pay

## 2024-07-12 MED ORDER — FINASTERIDE 5 MG PO TABS
5.0000 mg | ORAL_TABLET | Freq: Every day | ORAL | 3 refills | Status: AC
  Filled 2024-07-12: qty 90, 90d supply, fill #0
  Filled 2024-08-01: qty 90, 90d supply, fill #1

## 2024-07-17 ENCOUNTER — Other Ambulatory Visit (HOSPITAL_COMMUNITY): Payer: Self-pay

## 2024-07-17 MED ORDER — MOUNJARO 7.5 MG/0.5ML ~~LOC~~ SOAJ
SUBCUTANEOUS | 5 refills | Status: AC
  Filled 2024-07-17: qty 2, 28d supply, fill #0
  Filled 2024-08-01 – 2024-08-24 (×2): qty 2, 28d supply, fill #1

## 2024-07-18 ENCOUNTER — Other Ambulatory Visit: Payer: Self-pay

## 2024-08-01 ENCOUNTER — Other Ambulatory Visit (HOSPITAL_COMMUNITY): Payer: Self-pay

## 2024-08-01 ENCOUNTER — Other Ambulatory Visit: Payer: Self-pay

## 2024-08-08 ENCOUNTER — Other Ambulatory Visit (HOSPITAL_COMMUNITY): Payer: Self-pay

## 2024-08-09 ENCOUNTER — Other Ambulatory Visit: Payer: Self-pay

## 2024-08-09 ENCOUNTER — Other Ambulatory Visit (HOSPITAL_COMMUNITY): Payer: Self-pay

## 2024-08-09 MED ORDER — VALSARTAN 80 MG PO TABS
80.0000 mg | ORAL_TABLET | Freq: Every day | ORAL | 1 refills | Status: AC
  Filled 2024-08-09: qty 90, 90d supply, fill #0

## 2024-08-16 ENCOUNTER — Encounter: Payer: Self-pay | Admitting: Dietician

## 2024-08-16 DIAGNOSIS — K219 Gastro-esophageal reflux disease without esophagitis: Secondary | ICD-10-CM | POA: Insufficient documentation

## 2024-08-16 NOTE — Progress Notes (Signed)
 Medical Nutrition Therapy  Appointment Start time:  15  Appointment End time:  1130  Primary concerns today: weight loss   Referral diagnosis: Employee Visit 1; no referral diagnosis Preferred learning style: no preference indicated Learning readiness: ready   NUTRITION ASSESSMENT   Anthropometrics   Not assessed (virtual visit)  Clinical Medical Hx: reviewed; allergies, arthrtiis, GERD, sleep apnea with CPAP, HLD, HTN, prediabetes Medications: reviewed Labs: reviewed; mounjaro  Notable Signs/Symptoms: none reported Food Allergies: none reported  Lifestyle & Dietary Hx  Pt reports he wants to lose weight. Pt reports he did some nutrition classes with his ex.   Pt reports he started at 308 lb, and is currently 260 lb. Pt reports he was originally on ozempic , but then had bladder cancer and stopped that medication, and is now on mounjaro . Pt reports he notices he is not as hungry, and may skip a meal.   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 Pt reports he used to be more active in exercise and wants to get back to it. 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 Pt reports he hasn't done consistent exercise in about 5 years.   Pt reports he and his partner eat mostly from home, typically whole foods and does not eat a lot of processed foods. He reports they may go out 1-2 times per month. Pt reports his girlfriend/partner does most of the cooking, but he also helps.   Pt reports typical fluid intake is water, coffee, and beer. Pt reports buying 3 12 packs of craft beers weekly.   Estimated daily fluid intake: 64 oz Supplements: vitamin b12, vitamin d, fish oil Sleep: 9/10pm-5am. Wears CPAP. Stress / self-care: low stress Current average weekly physical activity: ADLs  24-Hr Dietary Recall First Meal: none OR  quiche Snack: none Second Meal: chili (white beans, tomatoes, beef), 2 pieces sourdough Snack: pickled quail egg Third Meal: shrimp and grits Snack: none Beverages: 1-2 c coffee with cream, 36 beer per week, 64 oz water   NUTRITION DIAGNOSIS  NB-1.1 Food and nutrition-related knowledge deficit As related to lack of prior education by a registered dietitian.  As evidenced by pt report.   NUTRITION INTERVENTION  Nutrition education (E-1) on the following topics:   Protein and GLP-1 Protein intake is crucial when you're on weight loss medication for several reasons, especially if the medication is designed to suppress appetite or alter your metabolism. Heres why it matters:  1. Preservation of Lean Muscle Mass: When you're losing weight, you typically lose both fat and muscle. Protein helps preserve lean muscle mass, which is important because muscle burns more calories than fat. By maintaining muscle, you can keep your metabolism functioning efficiently, even as you lose weight.  2. Satiety: Protein is known to promote feelings of fullness and satisfaction, helping to curb hunger. This can be especially important when you're on weight loss medication that suppresses your appetite, as it can help you avoid nutrient deficiencies by ensuring that your meals are still balanced and nutrient-dense.  3. Recovery and Repair: If you're exercising while on weight loss medication, protein is essential for muscle recovery and repair. Even if you're not exercising intensely, protein is still needed for repairing tissues and supporting bodily functions.  4. Preventing Nutrient Deficiencies: If weight loss medications are limiting your calorie intake, there's  a risk that you could end up not consuming enough essential nutrients, including protein. Consuming adequate protein can help prevent nutrient deficiencies and support overall health.  5. Maintaining Metabolism: Weight loss medications, especially  those that drastically reduce appetite, can lower overall food intake, potentially slowing down your metabolism. Adequate protein intake supports metabolism and ensures that youre still getting the essential building blocks for energy production and hormone regulation.   Plate Method Fruits & Vegetables: Aim to fill half your plate with a variety of fruits and vegetables. They are rich in vitamins, minerals, and fiber, and can help reduce the risk of chronic diseases. Choose a colorful assortment of fruits and vegetables to ensure you get a wide range of nutrients. Grains and Starches: Make at least half of your grain choices whole grains, such as brown rice, whole wheat bread, and oats. Whole grains provide fiber, which aids in digestion and healthy cholesterol levels. Aim for whole forms of starchy vegetables such as potatoes, sweet potatoes, beans, peas, and corn, which are fiber rich and provide many vitamins and minerals.  Protein: Incorporate lean sources of protein, such as poultry, fish, beans, nuts, and seeds, into your meals. Protein is essential for building and repairing tissues, staying full, balancing blood sugar, as well as supporting immune function. Dairy: Include low-fat or fat-free dairy products like milk, yogurt, and cheese in your diet. Dairy foods are excellent sources of calcium  and vitamin D, which are crucial for bone health.   Handouts Provided Include  Plate Method (emailed)  Learning Style & Readiness for Change Teaching method utilized: Visual & Auditory  Demonstrated degree of understanding via: Teach Back  Barriers to learning/adherence to lifestyle change: none  Goals Established by Pt  Goal 1: get some exercise 3 times a week.   Goal 2: eat at least 3 meals per day, with a protein source in each.   Goal 3: abstain from beer 3 days of the week.    MONITORING & EVALUATION Dietary intake, weekly physical activity, and follow up in 6 weeks.  Next Steps   Patient is to call for questions.

## 2024-08-24 ENCOUNTER — Other Ambulatory Visit: Payer: Self-pay

## 2024-09-27 ENCOUNTER — Encounter: Admitting: Dietician
